# Patient Record
Sex: Male | Born: 1990
Health system: Southern US, Academic
[De-identification: ages and names within clinical notes are randomized; demographics above are authoritative.]

## PROBLEM LIST (undated history)

## (undated) ENCOUNTER — Encounter

---

## 2016-10-07 DIAGNOSIS — M25512 Pain in left shoulder: Principal | ICD-10-CM

## 2016-10-08 ENCOUNTER — Inpatient Hospital Stay: Attending: Orthopaedic Surgery | Primary: Family Medicine

## 2016-12-04 DIAGNOSIS — L03011 Cellulitis of right finger: Secondary | ICD-10-CM

## 2016-12-04 DIAGNOSIS — R7982 Elevated C-reactive protein (CRP): Secondary | ICD-10-CM

## 2016-12-04 DIAGNOSIS — B9562 Methicillin resistant Staphylococcus aureus infection as the cause of diseases classified elsewhere: Secondary | ICD-10-CM

## 2016-12-04 DIAGNOSIS — F17210 Nicotine dependence, cigarettes, uncomplicated: Secondary | ICD-10-CM

## 2016-12-04 DIAGNOSIS — R000 Tachycardia, unspecified: Secondary | ICD-10-CM

## 2016-12-04 DIAGNOSIS — L02511 Cutaneous abscess of right hand: Secondary | ICD-10-CM

## 2016-12-04 DIAGNOSIS — K5903 Drug induced constipation: Secondary | ICD-10-CM

## 2016-12-04 DIAGNOSIS — A419 Sepsis, unspecified organism: Secondary | ICD-10-CM

## 2016-12-04 DIAGNOSIS — T63484A Toxic effect of venom of other arthropod, undetermined, initial encounter: Secondary | ICD-10-CM

## 2016-12-04 DIAGNOSIS — S60469A Insect bite (nonvenomous) of unspecified finger, initial encounter: Principal | ICD-10-CM

## 2016-12-04 DIAGNOSIS — M25531 Pain in right wrist: Secondary | ICD-10-CM

## 2016-12-04 DIAGNOSIS — E7800 Pure hypercholesterolemia, unspecified: Principal | ICD-10-CM

## 2016-12-04 DIAGNOSIS — W57XXXA Bitten or stung by nonvenomous insect and other nonvenomous arthropods, initial encounter: Secondary | ICD-10-CM

## 2016-12-04 MED ORDER — TRAMADOL HCL 50 MG PO TABS
50 mg | Freq: Four times a day (QID) | ORAL | 0 refills | Status: CP | PRN
Start: 2016-12-04 — End: 2016-12-05

## 2016-12-04 MED ORDER — NAPROXEN 250 MG PO TABS
500 mg | Freq: Once | ORAL | Status: CP
Start: 2016-12-04 — End: ?

## 2016-12-04 MED ORDER — CEPHALEXIN 500 MG PO CAPS
500 mg | Freq: Once | ORAL | Status: CP
Start: 2016-12-04 — End: ?

## 2016-12-04 MED ORDER — NAPROXEN 500 MG PO TABS
500 mg | Freq: Two times a day (BID) | ORAL | 0 refills | Status: CP | PRN
Start: 2016-12-04 — End: 2016-12-05

## 2016-12-04 MED ORDER — CLINDAMYCIN HCL 150 MG PO CAPS
300 mg | Freq: Once | ORAL | Status: CP
Start: 2016-12-04 — End: ?

## 2016-12-04 MED ORDER — CEPHALEXIN 500 MG PO CAPS
500 mg | Freq: Four times a day (QID) | ORAL | 0 refills | Status: CP
Start: 2016-12-04 — End: 2016-12-05

## 2016-12-04 MED ORDER — CLINDAMYCIN HCL 150 MG PO CAPS
300 mg | Freq: Three times a day (TID) | ORAL | 0 refills | Status: CP
Start: 2016-12-04 — End: 2016-12-05

## 2016-12-04 NOTE — ED Notes
Time of discharge: 0321  AM., Patient discharged to  Home.  Patient discharged  ambulatory. to exit with belongings in  Stable condition.  Patient escorted by  friend., Written discharge instructions given to  patient.  Patient/recipient  verbalizes discharge instructions.

## 2016-12-04 NOTE — ED Provider Notes
Gastrointestinal: Negative for nausea, vomiting and abdominal pain.   Genitourinary: Negative for dysuria and difficulty urinating.   Musculoskeletal: Negative for back pain and gait problem.   Skin: Positive for wound. Negative for rash.   Neurological: Negative for seizures and facial asymmetry.   All other systems reviewed and are negative.      Physical Exam     ED Triage Vitals   BP    Pulse    Resp    Temp    Temp src    Height    Weight    SpO2    BMI (Calculated)              Physical Exam   Skin: There is erythema.   Small bite to the right index finger with surrounding erythema involving part of the hand. No fluctuance. Some induration at the finger. No active discharge. Good ROM. Pulses intact.       Differential DDx: insect bite infected, cellulitis, abscess, tenosynovitis    Is this an Emergent Medical Condition? Yes - Severe Pain/Acute Onset of Symptons  409.901 FS  641.19 FS  627.732 (16) FS    ED Workup   Procedures    Labs:  - - No data to display      Imaging (Read by ED Provider):  not applicable      EKG (Read by ED Provider):  not applicable        ED Course & Re-Evaluation     ED Course        Dishong, Harlon DittyWilliam Patrick* 2:56 AM 12/04/2016     MDM   Decide to obtain history from someone other than the patient: No    Decide to obtain previous medical records: No    Clinical Lab Test(s): N/A    Diagnostic Tests (Radiology, EKG): N/A    Independent Visualization (ED US, Wet Prep, Other): No    Discussed patient with NON-ED Provider: None      ED Disposition   ED Disposition: No ED Disposition Set      ED Clinical Impression   ED Clinical Impression:   No Clinical Impression Set      ED Patient Status   Patient Status:   Good        ED Medical Evaluation Initiated   Medical Evaluation Initiated:  Yes, filed at 12/04/16 0248  by Dishong, Harlon DittyWilliam Patrick, MD            Dishong, Harlon DittyWilliam Patrick, MD  12/04/16 [phone]    Scribe Attestation: Dortha SchwalbeI, Josie Nguyen, have acted as a Neurosurgeonscribe for Hughes SupplyDishong,

## 2016-12-04 NOTE — ED Provider Notes
Physician Attestation: I have reviewed and confirmed the information stated by the scribe and made corrections and edits as appropriate. I have personally provided the services documented by the scribe.           Dishong, Harlon DittyWilliam Patrick, MD  12/04/16 [phone]

## 2016-12-04 NOTE — ED Triage Notes
Pt c/o a spider bite on his R index finger with pain radiating to hand, pt sts was bitten by a spider while at work x 1 day ago, pt sts now swelling and redness are getting worse, pt is AA&Ox4, NAD, VSS, calm cooperative, resp. Even and nonlabored, skin w/d color appropriate for ethnicity, no drainage noted, small abraision noted on posterior R index finger.

## 2016-12-04 NOTE — ED Provider Notes
History     Chief Complaint   Patient presents with   ? Cellulitis       Steve Garza is a 26 y.o. male who presents to the ED with CC of R index finger swelling and pain that onset last night. Patient suspects he was bitten by a spider at work, noted lesion to the right index finger. States within the past hour swelling has spread to the dorsum of his hand. He was unable to sleep tonight due to throbbing pain so he came to the ED.      The history is provided by the patient. No language interpreter was used.   Cellulitis    This is a new problem. The current episode started yesterday. The onset was gradual. The problem occurs continuously. The problem has been gradually worsening. The abscess is present on the right hand and right fingers. The problem is moderate. The abscess is characterized by painfulness and swelling. The patient was exposed to an insect bite/sting. The abscess first occurred outside. Pertinent negatives include no fever, no vomiting and no cough. He has received no recent medical care.       No Known Allergies    Patient's Medications   New Prescriptions    CEPHALEXIN (KEFLEX) 500 MG PO CAPSULE    Take 1 capsule by mouth 4 times daily for 5 days.    CLINDAMYCIN (CLEOCIN) 150 MG PO CAPSULE    Take 2 capsules by mouth 3 times daily for 5 days.    NAPROXEN (NAPROSYN) 500 MG PO TABLET    Take 1 tablet by mouth 2 times daily as needed for other (pain).    TRAMADOL (ULTRAM) 50 MG PO TABLET    Take 1 tablet by mouth every 6 hours as needed for pain.   Previous Medications    No medications on file   Modified Medications    No medications on file   Discontinued Medications    No medications on file       Past Medical History:   Diagnosis Date   ? High cholesterol        History reviewed. No pertinent surgical history.    No family history on file.    Social History     Social History   ? Marital status: Married     Spouse name: N/A   ? Number of children: N/A   ? Years of education: N/A

## 2016-12-04 NOTE — ED Provider Notes
History   No chief complaint on file.      HPI    Allergies not on file    Patient's Medications    No medications on file       No past medical history on file.    No past surgical history on file.    No family history on file.    Social History     Social History   ? Marital status: Married     Spouse name: N/A   ? Number of children: N/A   ? Years of education: N/A     Social History Main Topics   ? Smoking status: Not on file   ? Smokeless tobacco: Not on file   ? Alcohol use Not on file   ? Drug use: Unknown   ? Sexual activity: Not on file     Other Topics Concern   ? Not on file     Social History Narrative   ? No narrative on file       Review of Systems    Physical Exam     ED Triage Vitals   BP    Pulse    Resp    Temp    Temp src    Height    Weight    SpO2    BMI (Calculated)              Physical Exam    Differential DDx: ***    Is this an Emergent Medical Condition? {SH ED EMERGENT MEDICAL CONDITION:1602057001}  409.901 FS  641.19 FS  627.732 (16) FS    ED Workup   Procedures    Labs:  - - No data to display      Imaging (Read by ED Provider):  {Imaging findings:1603150103}      EKG (Read by ED Provider):  {EKG findings:1603150102}        ED Course & Re-Evaluation     ED Course          MDM   Decide to obtain history from someone other than the patient: {SH ED JX MDM - OBTAIN HISTORY:28378}    Decide to obtain previous medical records: {SH ED JX MDM - PREVIOUS MED REC - NO YES:28380}    Clinical Lab Test(s): {SH ED JX MDM ORDERED AND REVIEWED:28124}    Diagnostic Tests (Radiology, EKG): {SH ED JX MDM ORDERED AND REVIEWED:28124}    Independent Visualization (ED US, Wet Prep, Other): {SH ED JX MDM NO YES WILDCARD:26444}    Discussed patient with NON-ED Provider: {SH ED JX MDM - ANOTHER PROVIDER:28381}      ED Disposition   ED Disposition: No ED Disposition Set      ED Clinical Impression   ED Clinical Impression:   No Clinical Impression Set      ED Patient Status   Patient Status:

## 2016-12-04 NOTE — ED Provider Notes
Steve DittyWilliam Garza* 2:50 AM 12/04/2016     Physician Attestation: Marland Kitchen***

## 2016-12-04 NOTE — ED Provider Notes
Gastrointestinal: Negative for nausea, vomiting and abdominal pain.   Genitourinary: Negative for dysuria and difficulty urinating.   Musculoskeletal: Negative for back pain and gait problem.   Skin: Positive for wound. Negative for rash.   Neurological: Negative for seizures and facial asymmetry.   All other systems reviewed and are negative.      Physical Exam     ED Triage Vitals   BP    Pulse    Resp    Temp    Temp src    Height    Weight    SpO2    BMI (Calculated)              Physical Exam   Skin: There is erythema.   Small bite to the right index finger with surrounding erythema involving part of the hand. No fluctuance. Some induration at the finger. No active discharge. Good ROM. Pulses intact.       Differential DDx: insect bite infected, cellulitis, abscess, tenosynovitis    Is this an Emergent Medical Condition? Yes - Severe Pain/Acute Onset of Symptons  409.901 FS  641.19 FS  627.732 (16) FS    ED Workup   Procedures    Labs:  - - No data to display      Imaging (Read by ED Provider):  not applicable      EKG (Read by ED Provider):  not applicable        ED Course & Re-Evaluation     ED Course      26 y.o. M with CC of cellulitis to the right index finger extending into the hand. Patient to be given naproxen, Keflex, and clindamycin.  Dishong, Harlon DittyWilliam Patrick* 2:56 AM 12/04/2016     Eforcse reviewed and is negative.   Dishong, Harlon DittyWilliam Patrick* 2:59 AM 12/04/2016     MDM   Decide to obtain history from someone other than the patient: No    Decide to obtain previous medical records: No    Clinical Lab Test(s): N/A    Diagnostic Tests (Radiology, EKG): N/A    Independent Visualization (ED US, Wet Prep, Other): No    Discussed patient with NON-ED Provider: None      ED Disposition   ED Disposition: No ED Disposition Set      ED Clinical Impression   ED Clinical Impression:   No Clinical Impression Set      ED Patient Status   Patient Status:   Good        ED Medical Evaluation Initiated

## 2016-12-04 NOTE — ED Provider Notes
discharge. Good ROM. Pulses intact.   Psychiatric: He has a normal mood and affect.   Linear thought process   Nursing note and vitals reviewed.      Differential DDx: insect bite infected, cellulitis, abscess, tenosynovitis    Is this an Emergent Medical Condition? Yes - Severe Pain/Acute Onset of Symptons  409.901 FS  641.19 FS  627.732 (16) FS    ED Workup   Procedures    Labs:  - - No data to display      Imaging (Read by ED Provider):  not applicable      EKG (Read by ED Provider):  not applicable        ED Course & Re-Evaluation     ED Course      26 y.o. M with CC of cellulitis to the right index finger extending into the hand. Patient to be given naproxen, Keflex, and clindamycin.  Dishong, Harlon DittyWilliam Patrick* 2:56 AM 12/04/2016     Eforcse reviewed and is negative.   Dishong, Harlon DittyWilliam Patrick* 2:59 AM 12/04/2016     No abscess at this point. Discussed precautions for abscess, deep seated infection, tenosynovitis.  Pt aware of reasons for return to ED. Cellulitis marked.  Answered questions to the best of my ability.  Pt aware of precautions for return.  Pt comfortable with plan.      MDM   Decide to obtain history from someone other than the patient: No    Decide to obtain previous medical records: No    Clinical Lab Test(s): N/A    Diagnostic Tests (Radiology, EKG): N/A    Independent Visualization (ED US, Wet Prep, Other): No    Discussed patient with NON-ED Provider: None      ED Disposition   ED Disposition: Discharge      ED Clinical Impression   ED Clinical Impression:   Arthropod bite of finger, initial encounter  Cellulitis of finger of right hand      ED Patient Status   Patient Status:   Good        ED Medical Evaluation Initiated   Medical Evaluation Initiated:  Yes, filed at 12/04/16 0248  by Dishong, Harlon DittyWilliam Patrick, MD            Dishong, Harlon DittyWilliam Patrick, MD  12/04/16 0248    Scribe Attestation: Dortha SchwalbeI, Josie Nguyen, have acted as a Neurosurgeonscribe for Geanie Kenningishong, William Patrick* 2:50 AM 12/04/2016

## 2016-12-04 NOTE — ED Provider Notes
Social History Main Topics   ? Smoking status: Current Every Day Smoker     Packs/day: 1.00     Types: Cigarettes   ? Smokeless tobacco: Never Used   ? Alcohol use Yes      Comment: rarely   ? Drug use: No   ? Sexual activity: Not Asked     Other Topics Concern   ? None     Social History Narrative   ? None       Review of Systems   Constitutional: Negative for fever and chills.   HENT: Negative for facial swelling and voice change.    Eyes: Negative for pain and discharge.   Respiratory: Negative for cough and shortness of breath.    Cardiovascular: Negative for chest pain and palpitations.   Gastrointestinal: Negative for nausea, vomiting and abdominal pain.   Genitourinary: Negative for dysuria and difficulty urinating.   Musculoskeletal: Negative for back pain and gait problem.   Skin: Positive for wound. Negative for rash.   Neurological: Negative for seizures and facial asymmetry.   All other systems reviewed and are negative.      Physical Exam     ED Triage Vitals [12/04/16 0256]   BP 143/83   Pulse 78   Resp 16   Temp 36.8 ?C (98.2 ?F)   Temp src Oral   Height    Weight    SpO2 98 %   BMI (Calculated)              Physical Exam   Skin: There is erythema.   Small bite to the right index finger with surrounding erythema involving part of the hand. No fluctuance. Some induration at the finger. No active discharge. Good ROM. Pulses intact.       Differential DDx: insect bite infected, cellulitis, abscess, tenosynovitis    Is this an Emergent Medical Condition? Yes - Severe Pain/Acute Onset of Symptons  409.901 FS  641.19 FS  627.732 (16) FS    ED Workup   Procedures    Labs:  - - No data to display      Imaging (Read by ED Provider):  not applicable      EKG (Read by ED Provider):  not applicable        ED Course & Re-Evaluation     ED Course      26 y.o. M with CC of cellulitis to the right index finger extending into the hand. Patient to be given naproxen, Keflex, and clindamycin.

## 2016-12-04 NOTE — ED Provider Notes
Dishong, Harlon DittyWilliam Patrick* 2:56 AM 12/04/2016     Eforcse reviewed and is negative.   Dishong, Harlon DittyWilliam Patrick* 2:59 AM 12/04/2016     MDM   Decide to obtain history from someone other than the patient: No    Decide to obtain previous medical records: No    Clinical Lab Test(s): N/A    Diagnostic Tests (Radiology, EKG): N/A    Independent Visualization (ED US, Wet Prep, Other): No    Discussed patient with NON-ED Provider: None      ED Disposition   ED Disposition: Discharge      ED Clinical Impression   ED Clinical Impression:   Arthropod bite of finger, initial encounter  Cellulitis of finger of right hand      ED Patient Status   Patient Status:   Good        ED Medical Evaluation Initiated   Medical Evaluation Initiated:  Yes, filed at 12/04/16 0248  by Dishong, Harlon DittyWilliam Patrick, MD            Dishong, Harlon DittyWilliam Patrick, MD  12/04/16 0248    Scribe Attestation: Dortha SchwalbeI, Josie Nguyen, have acted as a Neurosurgeonscribe for Geanie KenningDishong, William Patrick* 2:50 AM 12/04/2016     Physician Attestation: Marland Kitchen***

## 2016-12-04 NOTE — ED Provider Notes
History   No chief complaint on file.      Steve [name] is a 26 y.o. male who presents to the ED with CC of R index finger swelling and pain that onset last night. Patient suspects he was bitten by a spider at work, noted lesion to the right index finger. States within the past hour swelling has spread to the dorsum of his hand. He was unable to sleep tonight due to throbbing pain so he came to the ED.      The history is provided by the patient. No language interpreter was used.   Cellulitis    This is a new problem. The current episode started yesterday. The onset was gradual. The problem occurs continuously. The problem has been gradually worsening. The abscess is present on the right hand and right fingers. The problem is moderate. The abscess is characterized by painfulness and swelling. The patient was exposed to an insect bite/sting. The abscess first occurred outside. Pertinent negatives include no fever, no vomiting and no cough. He has received no recent medical care.       Allergies not on file    Patient's Medications    No medications on file       No past medical history on file.    No past surgical history on file.    No family history on file.    Social History     Social History   ? Marital status: Married     Spouse name: N/A   ? Number of children: N/A   ? Years of education: N/A     Social History Main Topics   ? Smoking status: Not on file   ? Smokeless tobacco: Not on file   ? Alcohol use Not on file   ? Drug use: Unknown   ? Sexual activity: Not on file     Other Topics Concern   ? Not on file     Social History Narrative   ? No narrative on file       Review of Systems   Constitutional: Negative for fever and chills.   HENT: Negative for facial swelling and voice change.    Eyes: Negative for pain and discharge.   Respiratory: Negative for cough and shortness of breath.    Cardiovascular: Negative for chest pain and palpitations.

## 2016-12-04 NOTE — ED Provider Notes
{  SH ED Lamonte SakaiJX PATIENT [phone]}        ED Medical Evaluation Initiated   Medical Evaluation Initiated:  Yes, filed at 12/04/16 0248  by Dishong, Harlon DittyWilliam Patrick, MD            Dishong, Harlon DittyWilliam Patrick, MD  12/04/16 [phone]

## 2016-12-04 NOTE — ED Provider Notes
Medical Evaluation Initiated:  Yes, filed at 12/04/16 0248  by Dishong, Harlon DittyWilliam Patrick, MD            Dishong, Harlon DittyWilliam Patrick, MD  12/04/16 [phone]    Scribe Attestation: Dortha SchwalbeI, Josie Nguyen, have acted as a Neurosurgeonscribe for Geanie KenningDishong, William Patrick* 2:50 AM 12/04/2016     Physician Attestation: Marland Kitchen***

## 2016-12-04 NOTE — ED Provider Notes
Social History Main Topics   ? Smoking status: Current Every Day Smoker     Packs/day: 1.00     Types: Cigarettes   ? Smokeless tobacco: Never Used   ? Alcohol use Yes      Comment: rarely   ? Drug use: No   ? Sexual activity: Not Asked     Other Topics Concern   ? None     Social History Narrative   ? None       Review of Systems   Constitutional: Negative for fever and chills.   HENT: Negative for facial swelling and voice change.    Eyes: Negative for pain and discharge.   Respiratory: Negative for cough and shortness of breath.    Cardiovascular: Negative for chest pain and palpitations.   Gastrointestinal: Negative for nausea, vomiting and abdominal pain.   Genitourinary: Negative for dysuria and difficulty urinating.   Musculoskeletal: Negative for back pain and gait problem.   Skin: Positive for wound. Negative for rash.   Neurological: Negative for seizures and facial asymmetry.   All other systems reviewed and are negative.      Physical Exam     ED Triage Vitals [12/04/16 0256]   BP 143/83   Pulse 78   Resp 16   Temp 36.8 ?C (98.2 ?F)   Temp src Oral   Height    Weight    SpO2 98 %   BMI (Calculated)              Physical Exam   Constitutional: He is oriented to person, place, and time. He appears well-developed and well-nourished. No distress.   HENT:   Head: Normocephalic and atraumatic.   MMM   Eyes: Conjunctivae are normal. Pupils are equal, round, and reactive to light.   Cardiovascular: Normal rate, regular rhythm, normal heart sounds and intact distal pulses.    Pulmonary/Chest: Effort normal and breath sounds normal.   Musculoskeletal:   Extremities well perfursed. Distal NV intact.     Neurological: He is alert and oriented to person, place, and time.   Skin: Skin is warm and dry. No rash noted. He is not diaphoretic. There is erythema.   Small bite to the right index finger with surrounding erythema involving part of the hand. No fluctuance. Some induration at the finger. No active

## 2016-12-05 ENCOUNTER — Inpatient Hospital Stay
Admit: 2016-12-05 | Discharge: 2016-12-09 | Disposition: A | Discharge status: Home / Self Care | Attending: Certified Registered" | Admitting: Internal Medicine

## 2016-12-05 ENCOUNTER — Inpatient Hospital Stay
Admit: 2016-12-05 | Discharge: 2016-12-09 | Disposition: A | Discharge status: Home / Self Care | Admitting: Internal Medicine

## 2016-12-05 ENCOUNTER — Inpatient Hospital Stay: Admit: 2016-12-05 | Discharge: 2016-12-09

## 2016-12-05 ENCOUNTER — Observation Stay: Admit: 2016-12-05 | Discharge: 2016-12-09 | Primary: Family Medicine

## 2016-12-05 DIAGNOSIS — L03011 Cellulitis of right finger: Secondary | ICD-10-CM

## 2016-12-05 DIAGNOSIS — K5903 Drug induced constipation: Secondary | ICD-10-CM

## 2016-12-05 DIAGNOSIS — E7800 Pure hypercholesterolemia, unspecified: Principal | ICD-10-CM

## 2016-12-05 DIAGNOSIS — L02511 Cutaneous abscess of right hand: Secondary | ICD-10-CM

## 2016-12-05 DIAGNOSIS — B999 Unspecified infectious disease: Secondary | ICD-10-CM

## 2016-12-05 DIAGNOSIS — B9562 Methicillin resistant Staphylococcus aureus infection as the cause of diseases classified elsewhere: Secondary | ICD-10-CM

## 2016-12-05 DIAGNOSIS — A419 Sepsis, unspecified organism: Principal | ICD-10-CM

## 2016-12-05 DIAGNOSIS — D72829 Elevated white blood cell count, unspecified: Secondary | ICD-10-CM

## 2016-12-05 DIAGNOSIS — T63484A Toxic effect of venom of other arthropod, undetermined, initial encounter: Secondary | ICD-10-CM

## 2016-12-05 DIAGNOSIS — R000 Tachycardia, unspecified: Secondary | ICD-10-CM

## 2016-12-05 DIAGNOSIS — R7982 Elevated C-reactive protein (CRP): Secondary | ICD-10-CM

## 2016-12-05 DIAGNOSIS — M25531 Pain in right wrist: Secondary | ICD-10-CM

## 2016-12-05 DIAGNOSIS — M79641 Pain in right hand: Secondary | ICD-10-CM

## 2016-12-05 DIAGNOSIS — F17210 Nicotine dependence, cigarettes, uncomplicated: Secondary | ICD-10-CM

## 2016-12-05 DIAGNOSIS — Z720 Tobacco use: Secondary | ICD-10-CM

## 2016-12-05 DIAGNOSIS — L03119 Cellulitis of unspecified part of limb: Secondary | ICD-10-CM

## 2016-12-05 MED ORDER — SODIUM CHLORIDE 0.9 % IV SOLN
INTRAVENOUS | Status: DC
Start: 2016-12-05 — End: 2016-12-06

## 2016-12-05 MED ORDER — CLINDAMYCIN PHOSPHATE IN D5W 900 MG/50ML IV SOLN
900 mg | Freq: Three times a day (TID) | INTRAVENOUS | Status: DC
Start: 2016-12-05 — End: 2016-12-06

## 2016-12-05 MED ORDER — SULFAMETHOXAZOLE-TRIMETHOPRIM 800-160 MG PO TABS
1 | ORAL_TABLET | Freq: Once | ORAL | Status: DC
Start: 2016-12-05 — End: 2016-12-05

## 2016-12-05 MED ORDER — SULFAMETHOXAZOLE-TRIMETHOPRIM 800-160 MG PO TABS
1 | ORAL_TABLET | Freq: Two times a day (BID) | ORAL | Status: DC
Start: 2016-12-05 — End: 2016-12-06

## 2016-12-05 MED ORDER — HYDROCODONE-ACETAMINOPHEN 5-325 MG PO TABS
1 | ORAL_TABLET | ORAL | Status: DC | PRN
Start: 2016-12-05 — End: 2016-12-10

## 2016-12-05 MED ORDER — ACETAMINOPHEN 325 MG PO TABS
650 mg | ORAL | Status: DC | PRN
Start: 2016-12-05 — End: 2016-12-06

## 2016-12-05 MED ORDER — CEPHALEXIN 500 MG PO CAPS
500 mg | Freq: Once | ORAL | Status: CP
Start: 2016-12-05 — End: ?

## 2016-12-05 MED ORDER — ONDANSETRON 4 MG PO TBDP
4 mg | Freq: Once | ORAL | Status: CP
Start: 2016-12-05 — End: ?

## 2016-12-05 MED ORDER — ALUM & MAG HYDROX-SIMETH 1200-1200-120 MG/30ML PO SUSP UD
30 mL | ORAL | Status: DC | PRN
Start: 2016-12-05 — End: 2016-12-06

## 2016-12-05 MED ORDER — TEMAZEPAM 15 MG PO CAPS
15 mg | Freq: Every evening | ORAL | Status: CP | PRN
Start: 2016-12-05 — End: ?

## 2016-12-05 MED ORDER — MORPHINE SULFATE 4 MG/ML IV SOLN CUSTOM JX
4 mg | Freq: Four times a day (QID) | INTRAVENOUS | Status: DC | PRN
Start: 2016-12-05 — End: 2016-12-07

## 2016-12-05 MED ORDER — LIDOCAINE HCL (PF) 1 % IJ SOLN SH
Freq: Once | Status: CP
Start: 2016-12-05 — End: ?

## 2016-12-05 MED ORDER — CEPHALEXIN 500 MG PO CAPS
500 mg | Freq: Once | ORAL | Status: DC
Start: 2016-12-05 — End: 2016-12-05

## 2016-12-05 NOTE — ED Notes
Bed: ED-09  Expected date:   Expected time:   Means of arrival:   Comments:  abscess

## 2016-12-05 NOTE — Consults
Current Outpatient Prescriptions on File Prior to Encounter   Medication Sig Dispense Refill   ? [DISCONTINUED] cephALEXin (KEFLEX) 500 MG PO Capsule Take 1 capsule by mouth 4 times daily for 5 days. 20 capsule 0   ? [DISCONTINUED] clindamycin (CLEOCIN) 150 MG PO Capsule Take 2 capsules by mouth 3 times daily for 5 days. 30 capsule 0   ? [DISCONTINUED] naproxen (NAPROSYN) 500 MG PO Tablet Take 1 tablet by mouth 2 times daily as needed for other (pain). 20 tablet 0   ? [DISCONTINUED] traMADol (ULTRAM) 50 MG PO Tablet Take 1 tablet by mouth every 6 hours as needed for pain. 10 tablet 0     Current Hospital Medications:  Scheduled:   ? ondansetron  4 mg Oral Once   ? sulfamethoxazole-trimethoprim  1 tablet Oral Q12H SCH     Continuous Infusions:   ? 0.9 % NaCl     ? 0.9 % NaCl     ? clindamycin       PRN:   acetaminophen 650 mg Oral Q4H PRN   aluminum-magnesium-simethicone hydroxide 30 mL Oral Q4H PRN   temazepam 15 mg Oral Nightly PRN     Allergies:  Patient has no known allergies.    Review of Systems:  All systems are negative except for:    As mentioned in the HPI.       Objective:      Patient Vitals for the past 8 hrs:   BP Temp Temp src Pulse Resp SpO2 Height Weight   12/05/16 1545 - - - 84 - 99 % - -   12/05/16 1539 (!) 122/100 - - - - - - -   12/05/16 1315 123/70 36.6 ?C (97.9 ?F) Oral 85 16 97 % 1.778 m (5' 10") 101.6 kg (224 lb)         Physical Exam  General:  Alert, oriented x 3, no acute distress    Cardiac:  Palpable pulses, normal rate in a regular rhythm    Respiratory:  Symmetric chest rise, good air movement    Right Upper Extremity:  Palpable radial pulse.  Sensation to light touch present in median/ulnar/radial/axillary nerve distributions.   Motor functioning in median/ulnar/radial/axillary nerve distributions.  AIN/PIN functioning  2cm area of fluctuance over dorsal index P1. Mild erythema present proximally to level of wrist.

## 2016-12-05 NOTE — ED Notes
Spoke with [facility]

## 2016-12-05 NOTE — ED Notes
[facility]. Patient stable, VSS, NAD, AOX3. States no needs at this time. Awaiting [place]'s arrival [place] [facility].

## 2016-12-05 NOTE — ED Triage Notes
Patient presents to ED c/o increased swelling, redness, and pain to infection area on right hand. He states he was unable to afford antibiotics. NAD. VSS. AOx4. Otherwiese skin warm and dry. Breathing unlabored, RR even. Oriented to room and call bell. States pain is 9/10, constant, throbbing and tight.

## 2016-12-05 NOTE — Consults
Department of Orthopedics  Consultation Note    Date of Consult: 12/05/2016    Requesting Physician: Sabin, PA-C     Subjective:      Reason for request: Right hand infection    Steve Garza is a 26 y.o. male who presents with 5 days of worsening pain and swelling about his right index finger and moving proximally about dorsum of hand.  Patient complains of pain in Right index finger, hand, and forearm. Patient describes pain as aching, moderate severity and stable. Modifying factors are nothing and has no associated symptoms. Patient states pain is 6 on a scale of 1-10.  Denies fevers, chills, malaise.    Denies pain in other locations. Denies numbness or tingling in extremities. Denies chest pain or shortness of breath. Denies loss of consciousness. Denies bowel or bladder incontinence.    1ppd smoker. Not diabetic. No steroid use.  No Family history of DVT  Last ate or drank at 5pm  Right handed  Insurance: Medicaid  Work: [employer]  Residence: [place]  Contact phone number: [phone]    There is no problem list on file for this patient.    Past Medical History:   Diagnosis Date   ? High cholesterol       History reviewed. No pertinent surgical history.  History reviewed. No pertinent family history.  Social History     Social History   ? Marital status: Married     Spouse name: N/A   ? Number of children: N/A   ? Years of education: N/A     Occupational History   ? Not on file.     Social History Main Topics   ? Smoking status: Current Every Day Smoker     Packs/day: 1.00     Types: Cigarettes   ? Smokeless tobacco: Never Used   ? Alcohol use Yes      Comment: rarely   ? Drug use: No   ? Sexual activity: Not on file     Other Topics Concern   ? Not on file     Social History Narrative   ? No narrative on file       I have confirmed the past medical, surgical, family and social history.    Home Medications:  No current facility-administered medications on file prior to encounter.

## 2016-12-05 NOTE — Consults
Current Outpatient Prescriptions on File Prior to Encounter   Medication Sig Dispense Refill   ? [DISCONTINUED] cephALEXin (KEFLEX) 500 MG PO Capsule Take 1 capsule by mouth 4 times daily for 5 days. 20 capsule 0   ? [DISCONTINUED] clindamycin (CLEOCIN) 150 MG PO Capsule Take 2 capsules by mouth 3 times daily for 5 days. 30 capsule 0   ? [DISCONTINUED] naproxen (NAPROSYN) 500 MG PO Tablet Take 1 tablet by mouth 2 times daily as needed for other (pain). 20 tablet 0   ? [DISCONTINUED] traMADol (ULTRAM) 50 MG PO Tablet Take 1 tablet by mouth every 6 hours as needed for pain. 10 tablet 0     Current Hospital Medications:  Scheduled:   ? ondansetron  4 mg Oral Once   ? sulfamethoxazole-trimethoprim  1 tablet Oral Q12H SCH     Continuous Infusions:   ? 0.9 % NaCl     ? 0.9 % NaCl     ? clindamycin       PRN:   acetaminophen 650 mg Oral Q4H PRN   aluminum-magnesium-simethicone hydroxide 30 mL Oral Q4H PRN   temazepam 15 mg Oral Nightly PRN     Allergies:  Patient has no known allergies.    Review of Systems:  All systems are negative except for:    As mentioned in the HPI.       Objective:      Patient Vitals for the past 8 hrs:   BP Temp Temp src Pulse Resp SpO2 Height Weight   12/05/16 1545 - - - 84 - 99 % - -   12/05/16 1539 (!) 122/100 - - - - - - -   12/05/16 1315 123/70 36.6 ?C (97.9 ?F) Oral 85 16 97 % 1.778 m (5\' 10" ) 101.6 kg (224 lb)         Physical Exam  General:  Alert, oriented x 3, no acute distress    Cardiac:  Palpable pulses, normal rate in a regular rhythm    Respiratory:  Symmetric chest rise, good air movement    Right Upper Extremity:  Palpable radial pulse.  Sensation to light touch present in median/ulnar/radial/axillary nerve distributions.   Motor functioning in median/ulnar/radial/axillary nerve distributions.  AIN/PIN functioning  2cm area of fluctuance over dorsal index P1. Mild erythema present proximally to level of wrist.

## 2016-12-05 NOTE — Consults
Tender to palpation of index finger and dorsally and proximally to level of mid forearm. No tenderness over flexor tendon sheath.   No deformities or crepitus.  Compartments soft/compressible. No pain with passive stretch of fingers.    Data Review:   Xrays: Plain films were reviewed by orthopaedic team  XR R hand: No osseous abnormality    CRP: 31  ESR: 13  WBC: 11.9    Procedure  Verbal consent for the procedure was obtained from the patient. Correct site was confirmed verbally. Time out performed. Skin was prepared with alcohol. An 11 blade scalpel was used to incise the dorsal aspect of R index finger abscess. Approximately 2cc of blood and thick purulent material was expressed.      Assessment & Plan:     26 y.o. male with [name].      -The nature of the problem was discussed in detail with the patient.  The alternative treatments were discussed with the patient.  The anticipated benefits and expected outcomes of the proposed treatments were discussed, as well as the risks and benefits associated with the treatment options.  In addition, the consequences of no treatment were discussed in detail with the patient.  The patient was given a chance to ask any questions they may have had about the proposed treatment plan, and these questions were answered by the treating physician.   -pain control per ED  -No weight bearing R hand  -Wound care: Allow index finger to drain overnight  -IV Clindamycin per ED  -Monitor for acutely worsening exam overnight including fevers, chills, and tenderness migrating proximally to level of elbow.  -NPO at midnight in case of OR tomorrow.  -Dispo with Ortho Hand team in the AM and reexamine for evidence of improvement on abx. Will update recs in AM.     Steve Garza  12/05/2016   7:13 PM

## 2016-12-05 NOTE — Consults
Tender to palpation of index finger and dorsally and proximally to level of mid forearm. No tenderness over flexor tendon sheath.   No deformities or crepitus.  Compartments soft/compressible. No pain with passive stretch of fingers.    Data Review:   Xrays: Plain films were reviewed by orthopaedic team  XR R hand: No osseous abnormality    CRP: 31  ESR: 13  WBC: 11.9    Procedure  Verbal consent for the procedure was obtained from the patient. Correct site was confirmed verbally. Time out performed. Skin was prepared with alcohol. An 11 blade scalpel was used to incise the dorsal aspect of R index finger abscess. Approximately 2cc of blood and thick purulent material was expressed.      Assessment & Plan:     26 y.o. male with [name].      -The nature of the problem was discussed in detail with the patient.  The alternative treatments were discussed with the patient.  The anticipated benefits and expected outcomes of the proposed treatments were discussed, as well as the risks and benefits associated with the treatment options.  In addition, the consequences of no treatment were discussed in detail with the patient.  The patient was given a chance to ask any questions they may have had about the proposed treatment plan, and these questions were answered by the treating physician.   -pain control per ED  -No weight bearing R hand  -Wound care: Allow index finger to drain overnight  -IV Clindamycin per ED  -Monitor for acutely worsening exam overnight including fevers, chills, and tenderness migrating proximally to level of elbow.  -NPO at midnight in case of OR tomorrow.  -Dispo with Ortho Hand team in the AM and reexamine for evidence of improvement on abx. Will update recs in AM.     Price Sessums  12/05/2016   7:13 PM

## 2016-12-05 NOTE — ED Triage Notes
Patient presents to ED c/o increased swelling, redness, and pain to infection area on right hand. He states he was unable to afford antibiotics. NAD. VSS. AOx4. Otherwiese skin warm and dry. Breathing unlabored, RR even. Oriented to room and call bell. States pain is 9/10, constant, throbbing and tight.

## 2016-12-05 NOTE — ED Notes
Patient receive to unit. Care assume, treatment in progress.

## 2016-12-05 NOTE — ED Notes
Report called to RN Myrna at [facility]. Patient stable, VSS, NAD, AOX3. States no needs at this time. Awaiting his wife's arrival [place] [facility].

## 2016-12-05 NOTE — ED Notes
Bed: ED-09  Expected date:   Expected time:   Means of arrival:   Comments:  abscess

## 2016-12-05 NOTE — Consults
Department of Orthopedics  Consultation Note    Date of Consult: 12/05/2016    Requesting Physician: Tamala FothergillSabin, PA-C     Subjective:      Reason for request: Right hand infection    Steve [name] is a 26 y.o. male who presents with 5 days of worsening pain and swelling about his right index finger and moving proximally about dorsum of hand.  Patient complains of pain in Right index finger, hand, and forearm. Patient describes pain as aching, moderate severity and stable. Modifying factors are nothing and has no associated symptoms. Patient states pain is 6 on a scale of 1-10.  Denies fevers, chills, malaise.    Denies pain in other locations. Denies numbness or tingling in extremities. Denies chest pain or shortness of breath. Denies loss of consciousness. Denies bowel or bladder incontinence.    1ppd smoker. Not diabetic. No steroid use.  No Family history of DVT  Last ate or drank at 5pm  Right handed  Insurance: Medicaid  Work: [employer]  [place]  Contact phone number: [phone]    There is no problem list on file for this patient.    Past Medical History:   Diagnosis Date   ? High cholesterol       History reviewed. No pertinent surgical history.  History reviewed. No pertinent family history.  Social History     Social History   ? Marital status: Married     Spouse name: N/A   ? Number of children: N/A   ? Years of education: N/A     Occupational History   ? Not on file.     Social History Main Topics   ? Smoking status: Current Every Day Smoker     Packs/day: 1.00     Types: Cigarettes   ? Smokeless tobacco: Never Used   ? Alcohol use Yes      Comment: rarely   ? Drug use: No   ? Sexual activity: Not on file     Other Topics Concern   ? Not on file     Social History Narrative   ? No narrative on file       I have confirmed the past medical, surgical, family and social history.    Home Medications:  No current facility-administered medications on file prior to encounter.

## 2016-12-06 DIAGNOSIS — K5903 Drug induced constipation: Secondary | ICD-10-CM

## 2016-12-06 DIAGNOSIS — L02511 Cutaneous abscess of right hand: Secondary | ICD-10-CM

## 2016-12-06 DIAGNOSIS — B9562 Methicillin resistant Staphylococcus aureus infection as the cause of diseases classified elsewhere: Secondary | ICD-10-CM

## 2016-12-06 DIAGNOSIS — M25531 Pain in right wrist: Secondary | ICD-10-CM

## 2016-12-06 DIAGNOSIS — B999 Unspecified infectious disease: Principal | ICD-10-CM

## 2016-12-06 DIAGNOSIS — R000 Tachycardia, unspecified: Secondary | ICD-10-CM

## 2016-12-06 DIAGNOSIS — A419 Sepsis, unspecified organism: Principal | ICD-10-CM

## 2016-12-06 DIAGNOSIS — E7800 Pure hypercholesterolemia, unspecified: Principal | ICD-10-CM

## 2016-12-06 DIAGNOSIS — T63484A Toxic effect of venom of other arthropod, undetermined, initial encounter: Secondary | ICD-10-CM

## 2016-12-06 DIAGNOSIS — R7982 Elevated C-reactive protein (CRP): Secondary | ICD-10-CM

## 2016-12-06 DIAGNOSIS — L03011 Cellulitis of right finger: Secondary | ICD-10-CM

## 2016-12-06 DIAGNOSIS — F17210 Nicotine dependence, cigarettes, uncomplicated: Secondary | ICD-10-CM

## 2016-12-06 MED ORDER — ACETAMINOPHEN 325 MG PO TABS
650 mg | ORAL | Status: DC | PRN
Start: 2016-12-06 — End: 2016-12-08

## 2016-12-06 MED ORDER — MIDAZOLAM HCL 2 MG/2ML IJ SOLN
Status: DC | PRN
Start: 2016-12-06 — End: 2016-12-06

## 2016-12-06 MED ORDER — FENTANYL CITRATE INJ 50 MCG/ML CUSTOM AMP/VIAL
Status: DC | PRN
Start: 2016-12-06 — End: 2016-12-06

## 2016-12-06 MED ORDER — LACTATED RINGERS IV SOLN
1000 mL | INTRAVENOUS | Status: CN
Start: 2016-12-06 — End: ?

## 2016-12-06 MED ORDER — ACETAMINOPHEN 10 MG/ML IV SOLN
1000 mg | Freq: Once | INTRAVENOUS | Status: DC
Start: 2016-12-06 — End: 2016-12-06

## 2016-12-06 MED ORDER — NALBUPHINE HCL 10 MG/ML IJ SOLN
2.5 mg | INTRAVENOUS | Status: DC | PRN
Start: 2016-12-06 — End: 2016-12-06

## 2016-12-06 MED ORDER — NALOXONE HCL 0.4 MG/ML IJ SOLN
.4 mg | INTRAVENOUS | Status: DC | PRN
Start: 2016-12-06 — End: 2016-12-06

## 2016-12-06 MED ORDER — PROPOFOL 10 MG/ML IV EMUL CUSTOM SH
Status: DC | PRN
Start: 2016-12-06 — End: 2016-12-06

## 2016-12-06 MED ORDER — LABETALOL HCL 5 MG/ML IV SOLN
10 mg | INTRAVENOUS | Status: DC | PRN
Start: 2016-12-06 — End: 2016-12-06

## 2016-12-06 MED ORDER — PHARMACY CONSULT-VANCOMYCIN JX
Status: DC | PRN
Start: 2016-12-06 — End: 2016-12-07

## 2016-12-06 MED ORDER — VANCOMYCIN HCL IN NACL 2-0.9 GM/500 ML-% IV SOLN
20 mg/kg | Freq: Three times a day (TID) | INTRAVENOUS | Status: DC
Start: 2016-12-06 — End: 2016-12-07

## 2016-12-06 MED ORDER — LACTATED RINGERS IV SOLN
1000 mL | INTRAVENOUS | Status: DC
Start: 2016-12-06 — End: 2016-12-06

## 2016-12-06 MED ORDER — NICOTINE 21 MG/24HR TD PT24
1 | MEDICATED_PATCH | Freq: Every day | TRANSDERMAL | Status: DC
Start: 2016-12-06 — End: 2016-12-10

## 2016-12-06 MED ORDER — CEFAZOLIN SODIUM 1 G IJ SOLR
Status: DC | PRN
Start: 2016-12-06 — End: 2016-12-06

## 2016-12-06 MED ORDER — HYDROMORPHONE HCL 2 MG/ML IJ SOLN JX
.5 mg | INTRAVENOUS | Status: DC | PRN
Start: 2016-12-06 — End: 2016-12-06

## 2016-12-06 MED ORDER — DIPHENHYDRAMINE HCL 25 MG PO CAPS
25 mg | Freq: Four times a day (QID) | ORAL | Status: DC | PRN
Start: 2016-12-06 — End: 2016-12-10

## 2016-12-06 MED ORDER — LACTATED RINGERS IV SOLN
1000 mL | Freq: Once | INTRAVENOUS | Status: CP
Start: 2016-12-06 — End: ?

## 2016-12-06 MED ORDER — HALOPERIDOL LACTATE 5 MG/ML IJ SOLN
.5 mg | INTRAVENOUS | Status: DC | PRN
Start: 2016-12-06 — End: 2016-12-06

## 2016-12-06 MED ORDER — HYDRALAZINE HCL 20 MG/ML IJ SOLN
5 mg | INTRAVENOUS | Status: DC | PRN
Start: 2016-12-06 — End: 2016-12-06

## 2016-12-06 MED ORDER — ENOXAPARIN SODIUM 40 MG/0.4ML SC SOLN
40 mg | Freq: Every day | SUBCUTANEOUS | Status: DC
Start: 2016-12-06 — End: 2016-12-10

## 2016-12-06 MED ORDER — CLINDAMYCIN PHOSPHATE IN D5W 900 MG/50ML IV SOLN
900 mg | Freq: Three times a day (TID) | INTRAVENOUS | Status: DC
Start: 2016-12-06 — End: 2016-12-06

## 2016-12-06 MED ORDER — LIDOCAINE HCL (PF) 1 % IJ SOLN SH
INTRAVENOUS | Status: DC | PRN
Start: 2016-12-06 — End: 2016-12-06

## 2016-12-06 MED ORDER — GLUCOSE 4 G PO CHEW JX
16 g | ORAL | Status: DC | PRN
Start: 2016-12-06 — End: 2016-12-10

## 2016-12-06 MED ORDER — OXYCODONE HCL 5 MG PO TABS
10 mg | ORAL | Status: DC | PRN
Start: 2016-12-06 — End: 2016-12-06

## 2016-12-06 MED ORDER — ALUM & MAG HYDROX-SIMETH 1200-1200-120 MG/30ML PO SUSP UD
30 mL | Freq: Four times a day (QID) | ORAL | Status: DC | PRN
Start: 2016-12-06 — End: 2016-12-10

## 2016-12-06 MED ORDER — DEXTROSE 50 % IV SOLN
30 mL | INTRAVENOUS | Status: DC | PRN
Start: 2016-12-06 — End: 2016-12-10

## 2016-12-06 MED ORDER — NICOTINE PATCH REMOVAL
1 | MEDICATED_PATCH | Freq: Every day | TRANSDERMAL | Status: DC
Start: 2016-12-06 — End: 2016-12-10

## 2016-12-06 NOTE — Immediate Post-Operative Note
[facility]  Immediate Post-Operative Note      Case Record #: [id number]  Steve Garza  02/09/1991, 26 y.o., male    Case Date: 12/06/2016    Staff:  Surgeon(s) and Role:     * Cordon, Jared, MD - Resident - Assisting     * Helms, [name], MD - Primary  Anesthesiologist: Cabral, John, MD  CRNA: Davis, John W, CRNA      Pre-Op Diagnosis:   Infection [B99.9]    Procedure(s):  DEBRIDEMENT OF RIGHT INDEX FINGER (Right)    Post-Op Diagnosis Codes:     * Infection [B99.9]      Teaching MD Documentation  1. A Resident/ACGME fellow participated in this case: Yes   2. Participating Resident/ACGME fellow was qualified for case type: Yes    3. Teaching surgeon is present entire case (including opening & closing): No    4. Teach surgeon is present during key portion(s) of the procedure as well as other aspects of the case: yes   A: Key portions of the procedure were: abscess drainage   B: Surgeon was immediately available for remainder of case: yes   C: Other teaching surgeon immediately available, if any was: no   D: Portion(s) of procedure covered by other teaching surgeon were: no        Findings/Description: right index dorsal P1 abscess    Specimens: None    SCD's applied: yes    EBL: 5 mls    Dictation #[id number]

## 2016-12-06 NOTE — Nursing Post-Procedure Note
Patient fully awake and oriented X4, patient denies any nausea at this time. Patient sleeping without discomfort in stretcher.  Patient fully recovered from anesthesia, patient sign out by anesthesiologist.  Change patient to boarding in pacu while awaiting for room 442-b to be clean.

## 2016-12-06 NOTE — ED CDU H&P
26 yo M with insect bite seen in ED yesterday prescribed antibiotics, unable to fill 2/2 to cost, worsening today, [facility] sent ot CDU for ortho evaluation and IV antibiotics, ortho evaluated, IV antibiotics, enlarged incision and will continue to follow tomorrow am for evaluation and dispo.   Steve MaltaJessica M Sabin, PA-C 11:15 PM 12/05/2016    Assessment and Plan     [name] is a 26 y.o. male admitted to CDU for cellulitis and finger abscess.    Potential Intervention in CDU-OBU  IV fluids  Frequent re-assessments and documentation of clinical condition and response to therapy                   Lenn CalSabin, Jessica M, PA-C  12/05/16 2316

## 2016-12-06 NOTE — ED CDU Note
PTT 35 24 - 35 seconds     ED Course   Procedures        MDM   Decide to obtain history from someone other than the patient: No    Decide to obtain previous medical records: No    Clinical Lab Test(s): Ordered and Reviewed    Diagnostic Tests (Radiology, EKG): Ordered and Reviewed    Independent Visualization (ED US, Wet Prep, Other): No    Discussed patient with NON-ED Provider: Consultant      Assessment and Plan   [name] is a 26 y.o. male admitted to CDU for right hand cellulitis.    Potential Intervention in CDU-OBU  Monitoring for potential arrhythmias or hemodynamic issues  IV fluids  IV antibiotics  Repeat laboratories as indicated  Pain medication as needed  Ortho consultation   Frequent re-assessments and documentation of clinical condition and response to therapy    Steve SinghSherri L Roberts, PA-C 8:58 AM 12/06/2016--pt was seen by ortho and would like pt NPO for probable OR today.    Steve SinghSherri L Roberts, PA-C 9:20 AM 12/06/2016--received call from OR that they will be coming to get pt for surgery--I have paged Ortho regarding admission orders.     Steve SinghSherri L Roberts, PA-C 9:28 AM 12/06/2016--ortho requesting that pt be admitted to hospital service    Steve SinghSherri L Roberts, PA-C 9:29 AM 12/06/2016--spoke to hospital service who has agreed to admit pt.  Transportation here to take pt to the OR.    Patient Status   Patient Status:  Steve Garza          Garza, Sherri L, [place]PA-C  12/06/16 [id number]

## 2016-12-06 NOTE — Op Note
PATIENT NAMEPLEZ Garza  MRN:   [id number]  DATE OF SERVICE:  12/06/2016    ATTENDING PHYSICIAN:  Juel Burrow, MD    ASSISTANT:  Valla Leaver, MD    PREOPERATIVE DIAGNOSIS:  Right dorsal index finger abscess.    POSTOPERATIVE DIAGNOSIS:  Right dorsal index finger abscess.    PROCEDURE PERFORMED:  Irrigation and debridement of right dorsal index finger abscess.    COMPLICATIONS:  None.    ANTIBIOTICS:  2g IV Ancef given after intraoperative cultures obtained.    ESTIMATED BLOOD LOSS:  4m.    PERTINENT FINDINGS:  Purulent abscess over right index finger dorsum of P1.    INDICATION FOR PROCEDURE:  [name] a 26year old gentleman who had 5 days of worsening pain and swelling to his right index finger.  [facility] where he had a bedside I&D performed in the Emergency Department.  Due to continued concerns, [facility].  He was evaluated by the Orthopedic Surgery resident on call who repeated a bedside D&I.  Due to no improvement in his clinical exam, the decision was made to proceed with formal operative irrigation and debridement.  All risks and benefits of the procedure were explained to the patient in detail including possible neurovascular injury and hand stiffness.  He wished to proceed as soon as possible.  Informed consent was obtained.    DESCRIPTION OF PROCEDURE:  The patient was met in the preoperative holding area.  The right index finger was marked by the surgeon.  Informed consent was verified.  He was taken to the Operating Room.  A preinduction timeout was performed.  The patient was able to transfer to the operating room table in the supine position on his own.  General anesthesia was administered.  The right upper extremity was prepped and draped in normal sterile fashion.  A final timeout was performed.  The right index finger

## 2016-12-06 NOTE — Immediate Post-Operative Note
[facility]  Im[facility] Post[facility]      Case Record #: [id number]  Steve Garza  [birth year], 26 y.o., male    Case Date: 12/06/2016    Staff:  Surgeon(s) and Role:     * Mitzi Davenportordon, Jared, MD - Resident - Assisting     * Helms, Traci SermonJonathan Robert, MD - Primary  Anesthesiologist: Elease Etienneabral, John, MD  CRNA: Casimer Leekavis, John W, CRNA      Pre-Op Diagnosis:   Infection [B99.9]    Procedure(s):  DEBRIDEMENT OF RIGHT INDEX FINGER (Right)    Post-Op Diagnosis Codes:     * Infection [B99.9]      Teaching MD Documentation  1. A Resident/ACGME fellow participated in this case: Yes   2. Participating Resident/ACGME fellow was qualified for case type: Yes    3. Teaching surgeon is present entire case (including opening & closing): No    4. Teach surgeon is present during key portion(s) of the procedure as well as other aspects of the case: yes   A: Key portions of the procedure were: abscess drainage   B: Surgeon was immediately available for remainder of case: yes   C: Other teaching surgeon immediately available, if any was: no   D: Portion(s) of procedure covered by other teaching surgeon were: no        Findings/Description: right index dorsal P1 abscess    Specimens: None    SCD's applied: yes    EBL: 5 mls    Dictation #[id number]

## 2016-12-06 NOTE — Progress Notes
Compartments soft/compressible.   Pain on passive stretch of index finger, swelling not fusiform.         Ashok Cordiaody Sanderson, MD  10:16 AM  12/06/2016

## 2016-12-06 NOTE — Op Note
TEACHING SURGEON ATTESTATION: I was present or immediately avialble.      [name] Helms, MD    Dictated by: Jared Cordon, MD    JC/NTS  DD:  12/06/2016 11:33:41  DT:  12/06/2016 13:02:12  Job#:  [id number]  Conf#:  [id number]

## 2016-12-06 NOTE — Consults
Admission to follow

## 2016-12-06 NOTE — H&P
-   S/p D and I 12/06/16 - cultures sent from OR  - Ortho to re eval the Wound on POD 2   - Cont IV vancomycin started in ER  - FOllow final cultures.   - NWB RUE  - Pain control.     # Sepsis - Leucocytosis and intermittent tachycardia  - No fever,.   - Follow final wound cultures.     # DVT ppx     [facility] Center[facility].vinay Gagadam  Hospital Medicine   12/06/2016 11:38 AM

## 2016-12-06 NOTE — ED CDU Note
Physical Exam     ED Triage Vitals [12/05/16 1315]   BP 123/70   Pulse 85   Resp 16   Temp 36.6 ?C (97.9 ?F)   Temp src Oral   Height 1.778 m   Weight 101.6 kg   SpO2 97 %   BMI (Calculated) 32.21       BP 126/69  - Pulse 72  - Temp 36.4 ?C (97.5 ?F) (Oral)  - Resp 21  - Ht 1.778 m  - Wt 101.6 kg  - SpO2 99%  - BMI 32.14 kg/m?      Physical Exam   Constitutional: He is oriented to person, place, and time. He appears well-developed and well-nourished. No distress.   HENT:   Head: Normocephalic and atraumatic.   Cardiovascular: Normal rate and regular rhythm.    Pulmonary/Chest: Breath sounds normal. No respiratory distress. He has no wheezes.   Abdominal: Soft.   Musculoskeletal: He exhibits edema and tenderness.        Hands:  Edema and decreased ROM noted to all pt's fingers on his right hand.   Neurological: He is alert and oriented to person, place, and time. No cranial nerve deficit.   Skin: Skin is warm. No rash noted. He is not diaphoretic. There is erythema. No pallor.   Psychiatric: He has a normal mood and affect. His behavior is normal. Judgment and thought content normal.   Nursing note and vitals reviewed.    Per Radiology: Xr Hand Right 3 Views    Result Date: 12/05/2016  EXAMINATION: XR HAND RIGHT 3 VIEWS CLINICAL INFORMATION: Infection. TECHNIQUE: 3 views of the right hand COMPARISON: None. FINDINGS: Normal anatomic alignment. No acute fracture or subluxation. Joint spaces are preserved. There is soft tissue swelling of the second digit. No radiopaque foreign body. Evidence of old fifth metacarpal and additional old carpal fractures.     No acute fracture. Soft tissue swelling of the second digit. I personally reviewed the images and the residents findings and agree with the above. Read By - Daniel Siragusa M.D.  Electronically Verified By - Daniel Siragusa M.D.  Released Date Time - 12/05/2016 7:10 PM  Resident - Anushi Patel  Recent Results (from the past 24 hour(s))   Sedimentation Rate

## 2016-12-06 NOTE — Op Note
Dictated by: Mitzi DavenportJared Cordon, MD    JC/NTS  DD:  12/06/2016 11:33:41  DT:  12/06/2016 13:02:12  Job#:  [id number]  Conf#:  [id number]

## 2016-12-06 NOTE — Anesthesia Postprocedure Evaluation
Post Anesthesia Eval    Respiratory function appropriate( normal respiratory rate and oxygen saturation, airway patent )  Cardiovascular function appropriate( normal heart rate and blood pressure )  Mental status appropriate  Patient normothermic  Pain well controlled  No uncontrolled nausea and vomiting  Patient appropriately hydrated  No apparent anesthesia complications  Patient tolerated procedure well

## 2016-12-06 NOTE — Progress Notes
Department of Pharmacy  Adult Pharmacokinetics Consult    The pharmacy has implemented the physician approved protocol for the vancomycin pharmacokinetics consult.    Steve Garza is a 26 y.o. male.     Patient Active Problem List   Diagnosis   ? Infection   .     Objective     Weight 107.5 kg (237 lb)    Ideal Body Weight 73.0 kg      Labs     Lab Results   Component Value Date/Time    WBC 11.93 (H) 12/05/2016 03:17 PM      Lab Results   Component Value Date/Time    CREATININE 0.85 12/05/2016 03:17 PM       Lab Results   Component Value Date/Time    BUN 11 12/05/2016 03:17 PM       No results found for: VANCOTROUGH  No results found for: VANCOPEAK  No results found for: VANCORANDOM      Assessment/Plan     1. Patient is on vancomycin day 1.  2. Vancomycin is for the treatment of cellulitis/ abscess of finger including joint area.  3. Estimated Creatinine Clearance: 160.3 mL/min (by C-G formula based on SCr of 0.85 mg/dL).   4. Goal Trough: 15 - 20 mcg/ml.  5. Start vancomycin 2000 mg Q 8 hours.  6. A vancomycin trough has been ordered for Tuesday.  7. We will continue to follow, adjusting dose per vancomycin levels, SCr and BUN.                                             Thank you.    ELIZABETH EICHEL, Pharmacist    Vancomycin and Aminoglycoside Policy and Procedure

## 2016-12-06 NOTE — Nursing Post-Procedure Note
Patient fully awake and oriented X4, patient denies any nausea at this time. Patient sleeping without discomfort in stretcher.  Patient fully recovered from anesthesia, patient sign out by anesthesiologist.  Change patient to boarding in pacu while awaiting for room 442-b to be clean.

## 2016-12-06 NOTE — H&P
Vitals: BP 121/59  - Pulse 90  - Temp 36.3 ?C (97.4 ?F) (Skin)  - Resp 22  - Ht 1.778 m (5\' 10" )  - Wt 101.6 kg (224 lb)  - SpO2 91%  - BMI 32.14 kg/m? , 91%,   Intake/Output Summary (Last 24 hours) at 12/06/16 1138  Last data filed at 12/06/16 1113   Gross per 24 hour   Intake              400 ml   Output                0 ml   Net              400 ml     General:  No acute distress. Moderately built.   HEENT:  PERL, No scleral icterus, Moist mucous membranes, Oropharynx clear   Resp:  Clear to auscultation, No wheezing, rales, or rhonchi . No use of accessory muscles.   CV:  Regular rhythm, Positive S1/S2, No murmurs heard  GI:  Soft, No abdominal tenderness, Nondistended, Bowel sounds present  Extremities:  Positive pulses, No edema, No cyanosis  Right index finger and palm in post OP dressing .  Psych: no agitation/ distress.   Neuro:  Cranial nerves II-XII intact, No focal deficits  Awake alert oriented to place , time and person.   Skin:  No suspicious lesions, No rashes  Lymphatics: no cervical or axillary lymphadenopathy      Data Review     Results for orders placed or performed during the hospital encounter of 12/05/16 (from the past 48 hour(s))   CBC and Differential    Collection Time: 12/05/16  3:17 PM    Narrative    The following orders were created for panel order CBC and Differential.  Procedure                               Abnormality         Status                     ---------                               -----------         ------                     CBC with Differential pa.Marland Kitchen.Marland Kitchen.[[id number]][[id number]]  Abnormal            Final result                 Please view results for these tests on the individual orders.   Sedimentation Rate    Collection Time: 12/05/16  3:17 PM   Result Value    Sed Rate 13   BMP    Collection Time: 12/05/16  3:17 PM   Result Value    Sodium 139    Potassium 4.0    Chloride 101    CO2 25    Urea Nitrogen 11    Creatinine 0.85    BUN/Creatinine Ratio 12.9    Glucose 83

## 2016-12-06 NOTE — Progress Notes
Department of Pharmacy  Adult Pharmacokinetics Consult    The pharmacy has implemented the physician approved protocol for the vancomycin pharmacokinetics consult.    Steve Garza is a 26 y.o. male.     Patient Active Problem List   Diagnosis   ? Infection   .     Objective     Weight 107.5 kg (237 lb)    Ideal Body Weight 73.0 kg      Labs     Lab Results   Component Value Date/Time    WBC 11.93 (H) 12/05/2016 03:17 PM      Lab Results   Component Value Date/Time    CREATININE 0.85 12/05/2016 03:17 PM       Lab Results   Component Value Date/Time    BUN 11 12/05/2016 03:17 PM       No results found for: VANCOTROUGH  No results found for: VANCOPEAK  No results found for: VANCORANDOM      Assessment/Plan     1. Patient is on vancomycin day 1.  2. Vancomycin is for the treatment of cellulitis/ abscess of finger including joint area.  3. Estimated Creatinine Clearance: 160.3 mL/min (by C-G formula based on SCr of 0.85 mg/dL).   4. Goal Trough: 15 - 20 mcg/ml.  5. Start vancomycin 2000 mg Q 8 hours.  6. A vancomycin trough has been ordered for Tuesday.  7. We will continue to follow, adjusting dose per vancomycin levels, SCr and BUN.                                             Thank you.    ELIZABETH EICHEL, Pharmacist    Vancomycin and Aminoglycoside Policy and Procedure

## 2016-12-06 NOTE — Nursing Post-Procedure Note
Patient room is clean, report given to RN Rosanna who will resume care of the patient at 4S room 442-B.  Pacu tech transferring patient to 4S. Belonging X3 bags return to patient unopened.

## 2016-12-06 NOTE — ED CDU Note
Diagnostic Tests (Radiology, EKG): {SH ED JX MDM ORDERED AND REVIEWED:28124}    Independent Visualization (ED US, Wet Prep, Other): {SH ED JX MDM NO YES WILDCARD:26444}    Discussed patient with NON-ED Provider: {SH ED JX MDM - ANOTHER PROVIDER:28381}      Assessment and Plan   Mr. Mcgahee is a 26 y.o. male admitted to CDU for right hand cellulitis.    Potential Intervention in CDU-OBU  Monitoring for potential arrhythmias or hemodynamic issues  IV fluids  IV antibiotics  Repeat laboratories as indicated  Pain medication as needed  Ortho consultation   Frequent re-assessments and documentation of clinical condition and response to therapy    Sherri L Roberts, PA-C 8:58 AM 12/06/2016--pt was seen by ortho and would like pt NPO for probable OR today.    Patient Status   Patient Status:  {SH ED JX PATIENT STATUS:1602057102}

## 2016-12-06 NOTE — H&P
Calcium 9.1    Osmolality Calc 276.1    Anion Gap 13    EGFR >59   CRP High Sensitive    Collection Time: 12/05/16  3:17 PM   Result Value    CRP, High Sensitivity 31.50 (H)   CBC with Differential panel result    Collection Time: 12/05/16  3:17 PM   Result Value    WBC 11.93 (H)    RBC 4.45 (L)    Hemoglobin 13.8 (L)    Hematocrit 38.3 (L)    MCV 86.1    MCH 31.0    MCHC 36.0    RDW 12.8    Platelet Count 264    MPV 10.6    nRBC % 0.0    Absolute NRBC Count 0.00    Neutrophils % 66.8    Lymphocytes % 21.0 (L)    Monocytes % 8.5 (H)    Eosinophils % 3.2    Immature Granulocytes % 0.3    Neutrophils Absolute 7.98    Lymphocytes Absolute 2.51    Monocytes Absolute 1.01    Eosinophils Absolute 0.38    Basophil Absolute 0.02    Absolute Immature Granulocytes 0.03 (H)    Basophils % 0.2   Type and Screen    Collection Time: 12/06/16  8:38 AM   Result Value    ABO Grouping A    Rh Type Positive    Antibody Screen Negative    Specimen Expiration 2016-12-09 23:59   PT-INR    Collection Time: 12/06/16  8:39 AM   Result Value    Protime 13.4    INR 1.0   PTT    Collection Time: 12/06/16  8:39 AM   Result Value    PTT 35       Per Radiology: Xr Hand Right 3 Views    Result Date: 12/05/2016  EXAMINATION: XR HAND RIGHT 3 VIEWS CLINICAL INFORMATION: Infection. TECHNIQUE: 3 views of the right hand COMPARISON: None. FINDINGS: Normal anatomic alignment. No acute fracture or subluxation. Joint spaces are preserved. There is soft tissue swelling of the second digit. No radiopaque foreign body. Evidence of old fifth metacarpal and additional old carpal fractures.     No acute fracture. Soft tissue swelling of the second digit. I personally reviewed the images and the residents findings and agree with the above. Read By Ronelle Nigh M.D.  Electronically Verified By Ronelle Nigh M.D.  Released Date Time - 12/05/2016 7:10 PM  Resident - [place] and [facility]        # Right index Finger cellulitis-

## 2016-12-06 NOTE — Progress Notes
Department of Orthopedics   Progress Note    Admit Date: 12/05/2016    LOS: 0 days     Assessment and Plan:     26 y.o. male  with [name].    -Pain control: per primary  -Diet: per primary  -NWB R Hand  -Elevate R Hand  -PT/OT to eval and treat  -DVT ppx: per primary  -IS 10x/hr, nursing to demonstrate proper technique  -NPO, maintenance IVF     -Plan to go to OR with Ortho Trauma today for D&I R Hand       Subjective:     NAEON. Pt states he has been NPO. States that his pain and swelling are getting worse. States that his ROM is decreasing compared to yesterday in the R index finger.    Patient denies N/V, F/C, chest pain, pressure, SOB and night sweats.     Objective:     Vital Signs: Last Filed Vitals Signs: 24 Hour Range   BP: 122/78 (08/14 0610) BP: (116-128)/(62-82)    Temp: 36.8 ?C (98.2 ?F) (08/14 0610) Temp:  [36.7 ?C (98 ?F)-37.1 ?C (98.7 ?F)]    Pulse: 87 (08/14 0610) Pulse:  [87-117]    Resp: 20 (08/14 0610) Resp:  [15-20]    SpO2: 100 % (08/14 0610) SpO2:  [98 %-100 %]            Date 12/05/16 0700 - 12/06/16 0659 12/06/16 0700 - 12/07/16 0659   Shift 0700-1459 1500-2259 2300-0659 24 Hour Total 0700-1459 1500-2259 2300-0659 24 Hour Total   I  N  T  A  K  E   IV Piggyback  100  100          Volume (mL) (clindamycin (CLEOCIN) IVPB 900 mg)  100  100        Shift Total  100  100       O  U  T  P  U  T   Shift Total           NET  100  100           Physical Exam  General: Alert, oriented x 3, no acute distress  Respiratory: No increased work of breathing, symmetrical chest rise   Cardiovascular: Regular rate    Right Upper Extremity:   2+ radial pulse. <2sec cap refill   SILT present in M/U/R/A nerve distributions.    +Motor M/U/R/A/PIN/AIN nerve distributions.   2cm area of fluctuance over dorsal index P1.    Mild erythema present proximally to level of wrist.    Tender to palpation of index finger and dorsally and proximally to level of mid forearm.    No deformities or crepitus.

## 2016-12-06 NOTE — ED CDU Attestation Note
ED CDU Attestation   I saw and evaluated the patient. I reviewed and agree with the findings and plan as documented in the note. I reviewed the patient's history. I discussed this patient with the ARNP/PA.   Comments: Two day progression from abscess-cellulitis to evolving tenosynovitis dominant (R) index finger. Pending Ortho for disposition and mgmt plan. Already on parenteral antibx's.                 Rios, Luis E, MD  12/06/16 0854

## 2016-12-06 NOTE — Op Note
was noted to have an approximately 15mm area of skin necrosis with a central area that was draining purulent material.  There was erythema extending up into the dorsum of the hand.  I began by extending the incision previously used about 5mm proximal and 5mm distal to measure about 15mm in total length.  At this time, it did not appear that the prior irrigation and debridements had gone deep enough as there was still intact soft tissue underneath this incision with just a small 2mm diameter hole where the abscess was draining.  Once sharp dissection was carried down all the way to the extensor tendon, there was a large amount of purulent material measuring about 3mm to 5mm in volume.  The abscess did not track volarly into the flexor tendon sheath nor proximally or distally.  The wound was then thoroughly irrigated with 6 liters of normal saline.  Following this, there were no signs of purulence.  It was noted that the erythema over the dorsum of the hand had vastly improved leaving just the 15mm area of necrosis of the skin.  The skin had questionable viability, but did not appear frankly necrotic.  At this point, the wound was packed with quarter-inch iodoform gauze.  One 4-0 nylon simple interrupted suture was placed in the proximal aspect to decrease the size of the incision, but still allow for satisfactory drainage.  Over this dry 4 x 4 gauze were placed and the hand was wrapped and 2-inch Ioban to just include the index finger.  The patient was then awoken from anesthesia and transferred to the PACU in stable condition.  The 2 grams of IV Ancef was given after intraoperative cultures.  Once purulent material was encountered at the abscess site, 2 intraoperative cultures were obtained.    DISPOSITION:  We will continue IV antibiotics for the patient.  We will wait for culture results, and we will do a wound check in 24 to 48 hours to assess for improvement.      Sharmaine BaseJonathan Robert Helms, MD

## 2016-12-06 NOTE — ED CDU Note
Collection Time: 12/05/16  3:17 PM   Result Value Ref Range    Sed Rate 13 0 - 15 mm/hr   BMP    Collection Time: 12/05/16  3:17 PM   Result Value Ref Range    Sodium 139 135 - 145 mmol/L    Potassium 4.0 3.3 - 4.6 mmol/L    Chloride 101 101 - 110 mmol/L    CO2 25 21 - 29 mmol/L    Urea Nitrogen 11 6 - 22 mg/dL    Creatinine 0.85 0.67 - 1.17 mg/dL    BUN/Creatinine Ratio 12.9 6.0 - 22.0 (calc)    Glucose 83 71 - 99 mg/dL    Calcium 9.1 8.6 - 10.0 mg/dL    Osmolality Calc 276.1     Anion Gap 13 4 - 16 mmol/L    EGFR >59 mL/min/1.73M2   CRP High Sensitive    Collection Time: 12/05/16  3:17 PM   Result Value Ref Range    CRP, High Sensitivity 31.50 (H) 0.00 - 5.00 mg/L   CBC with Differential panel result    Collection Time: 12/05/16  3:17 PM   Result Value Ref Range    WBC 11.93 (H) 4.5 - 11 x10E3/uL    RBC 4.45 (L) 4.50 - 6.30 x10E6/uL    Hemoglobin 13.8 (L) 14.0 - 18.0 g/dL    Hematocrit 38.3 (L) 40.0 - 54.0 %    MCV 86.1 82.0 - 101.0 fl    MCH 31.0 27.0 - 34.0 pg    MCHC 36.0 31.0 - 36.0 g/dL    RDW 12.8 12.0 - 16.1 %    Platelet Count 264 140 - 440 thou/cu mm    MPV 10.6 9.5 - 11.5 fl    nRBC % 0.0 0.0 - 1.0 %    Absolute NRBC Count 0.00     Neutrophils % 66.8 34.0 - 73.0 %    Lymphocytes % 21.0 (L) 25.0 - 45.0 %    Monocytes % 8.5 (H) 2.0 - 6.0 %    Eosinophils % 3.2 1.0 - 4.0 %    Immature Granulocytes % 0.3 0.0 - 2.0 %    Neutrophils Absolute 7.98 1.80 - 8.70 x10E3/uL    Lymphocytes Absolute 2.51 x10E3/uL    Monocytes Absolute 1.01 x10E3/uL    Eosinophils Absolute 0.38 x10E3/uL    Basophil Absolute 0.02 x10E3/uL    Absolute Immature Granulocytes 0.03 (H) 0 - 0 x10E3/uL    Basophils % 0.2 0 - 1 %     ED Course   Procedures        MDM   Decide to obtain history from someone other than the patient: {SH ED [place] MDM - OBTAIN POEUMPN:36144}    Decide to obtain previous medical records: [facility] ED [place] [facility] MDM - PREVIOUS MED REC - NO RXV:40086}    Clinical Lab Test(s): {SH ED [place] [facility] MDM ORDERED AND REVIEWED:28124}

## 2016-12-06 NOTE — ED CDU Note
Physical Exam     ED Triage Vitals [12/05/16 1315]   BP 123/70   Pulse 85   Resp 16   Temp 36.6 ?C (97.9 ?F)   Temp src Oral   Height 1.778 m   Weight 101.6 kg   SpO2 97 %   BMI (Calculated) 32.21       BP 126/69  - Pulse 72  - Temp 36.4 ?C (97.5 ?F) (Oral)  - Resp 21  - Ht 1.778 m  - Wt 101.6 kg  - SpO2 99%  - BMI 32.14 kg/m?     Physical Exam   Constitutional: He is oriented to person, place, and time. He appears well-developed and well-nourished. No distress.   HENT:   Head: Normocephalic and atraumatic.   Cardiovascular: Normal rate and regular rhythm.    Pulmonary/Chest: Breath sounds normal. No respiratory distress. He has no wheezes.   Abdominal: Soft.   Musculoskeletal: He exhibits edema and tenderness.        Hands:  Edema and decreased ROM noted to all pt's fingers on his right hand.   Neurological: He is alert and oriented to person, place, and time. No cranial nerve deficit.   Skin: Skin is warm. No rash noted. He is not diaphoretic. There is erythema. No pallor.   Psychiatric: He has a normal mood and affect. His behavior is normal. Judgment and thought content normal.   Nursing note and vitals reviewed.    Per Radiology: Xr Hand Right 3 Views    Result Date: 12/05/2016  EXAMINATION: XR HAND RIGHT 3 VIEWS CLINICAL INFORMATION: Infection. TECHNIQUE: 3 views of the right hand COMPARISON: None. FINDINGS: Normal anatomic alignment. No acute fracture or subluxation. Joint spaces are preserved. There is soft tissue swelling of the second digit. No radiopaque foreign body. Evidence of old fifth metacarpal and additional old carpal fractures.     No acute fracture. Soft tissue swelling of the second digit. I personally reviewed the images and the residents findings and agree with the above. Read By - Daniel Siragusa M.D.  Electronically Verified By - Daniel Siragusa M.D.  Released Date Time - 12/05/2016 7:10 PM  Resident - Anushi Patel  Recent Results (from the past 24 hour(s))   Sedimentation Rate

## 2016-12-06 NOTE — Progress Notes
Compartments soft/compressible.   Pain on passive stretch of index finger, swelling not fusiform.         Steve Sanderson, MD  10:16 AM  12/06/2016

## 2016-12-06 NOTE — ED CDU Note
Collection Time: 12/05/16  3:17 PM   Result Value Ref Range    Sed Rate 13 0 - 15 mm/hr   BMP    Collection Time: 12/05/16  3:17 PM   Result Value Ref Range    Sodium 139 135 - 145 mmol/L    Potassium 4.0 3.3 - 4.6 mmol/L    Chloride 101 101 - 110 mmol/L    CO2 25 21 - 29 mmol/L    Urea Nitrogen 11 6 - 22 mg/dL    Creatinine 0.85 0.67 - 1.17 mg/dL    BUN/Creatinine Ratio 12.9 6.0 - 22.0 (calc)    Glucose 83 71 - 99 mg/dL    Calcium 9.1 8.6 - 10.0 mg/dL    Osmolality Calc 276.1     Anion Gap 13 4 - 16 mmol/L    EGFR >59 mL/min/1.73M2   CRP High Sensitive    Collection Time: 12/05/16  3:17 PM   Result Value Ref Range    CRP, High Sensitivity 31.50 (H) 0.00 - 5.00 mg/L   CBC with Differential panel result    Collection Time: 12/05/16  3:17 PM   Result Value Ref Range    WBC 11.93 (H) 4.5 - 11 x10E3/uL    RBC 4.45 (L) 4.50 - 6.30 x10E6/uL    Hemoglobin 13.8 (L) 14.0 - 18.0 g/dL    Hematocrit 38.3 (L) 40.0 - 54.0 %    MCV 86.1 82.0 - 101.0 fl    MCH 31.0 27.0 - 34.0 pg    MCHC 36.0 31.0 - 36.0 g/dL    RDW 12.8 12.0 - 16.1 %    Platelet Count 264 140 - 440 thou/cu mm    MPV 10.6 9.5 - 11.5 fl    nRBC % 0.0 0.0 - 1.0 %    Absolute NRBC Count 0.00     Neutrophils % 66.8 34.0 - 73.0 %    Lymphocytes % 21.0 (L) 25.0 - 45.0 %    Monocytes % 8.5 (H) 2.0 - 6.0 %    Eosinophils % 3.2 1.0 - 4.0 %    Immature Granulocytes % 0.3 0.0 - 2.0 %    Neutrophils Absolute 7.98 1.80 - 8.70 x10E3/uL    Lymphocytes Absolute 2.51 x10E3/uL    Monocytes Absolute 1.01 x10E3/uL    Eosinophils Absolute 0.38 x10E3/uL    Basophil Absolute 0.02 x10E3/uL    Absolute Immature Granulocytes 0.03 (H) 0 - 0 x10E3/uL    Basophils % 0.2 0 - 1 %     ED Course   Procedures        MDM   Decide to obtain history from someone other than the patient: No    Decide to obtain previous medical records: No    Clinical Lab Test(s): Ordered and Reviewed    Diagnostic Tests (Radiology, EKG): Ordered and Reviewed    Independent Visualization (ED [place], Wet Prep, Other): No

## 2016-12-06 NOTE — Op Note
TEACHING SURGEON ATTESTATION: I was present or immediately avialble.      Sharmaine BaseJonathan Robert Helms, MD    Dictated by: Mitzi DavenportJared Cordon, MD    JC/NTS  DD:  12/06/2016 11:33:41  DT:  12/06/2016 13:02:12  Job#:  [id number]  Conf#:  [id number]

## 2016-12-06 NOTE — Anesthesia Preprocedure Evaluation
Department of Anesthesiology  Pre-operative Evaluation Record    Patient Name: Steve Garza                  Sex: male                  Age: 26 y.o.                  MRN: [id number]     Allergies:   Patient has no known allergies.    No outpatient prescriptions have been marked as taking for the 12/05/16 encounter [facility](Hospital Encounter).       Prior Surgeries:   History reviewed. No pertinent surgical history.    Social   ETOH:       Alcohol Use   ? Yes     Comment: rarely     Tobacco:   History   Smoking Status   ? Current Every Day Smoker   ? Packs/day: 1.00   ? Types: Cigarettes   Smokeless Tobacco   ? Never Used     Drugs:   History   Drug Use No       System Evaluation and Medical History:        Physical Exam:        Anesthesia Plan:       Patient has an ASA score of 2 and has been NPO for > 8 hours. Plan for anesthesia technique is general.  With an intravenous type of induction. Anesthetic plan and risks has been discussed with the patient. The following pulse oximetry, body tempature, ETC02, NIBP and 6-lead EKG will be used for intraoperative monitoring.   Transfusion plan has been discussed with the patient. Post operative pain will be controled by IV meds.       Anesthesia Attending:   Airway: Mallampati score II.       Pulmonary: On pulmonary examination breath sounds clear to auscultation.   GCS: Patients total GCS score is 15, with 4 for eye response, with 5 for verbal response, and with 6 for motor response.I have performed a pre-anesthesia examination, evaluation, and prescribed the anesthesia plan.

## 2016-12-06 NOTE — ED CDU Note
Discussed patient with NON-ED Provider: Consultant      Assessment and Plan   [name] is a 26 y.o. male admitted to CDU for right hand cellulitis.    Potential Intervention in CDU-OBU  Monitoring for potential arrhythmias or hemodynamic issues  IV fluids  IV antibiotics  Repeat laboratories as indicated  Pain medication as needed  Ortho consultation   Frequent re-assessments and documentation of clinical condition and response to therapy    Sherri L Roberts, PA-C 8:58 AM 12/06/2016--pt was seen by ortho and would like pt NPO for probable OR today.    Sherri L Roberts, PA-C 9:20 AM 12/06/2016--received call from OR that they will be coming to get pt for surgery--I have paged Ortho regarding admission orders.     Patient Status   Patient Status:  Fair

## 2016-12-06 NOTE — H&P
-   S/p D and I 12/06/16 - cultures sent from OR  - Ortho to re eval the Wound on POD 2   - Cont IV vancomycin started in ER  - FOllow final cultures.   - NWB RUE  - Pain control.     # Sepsis - Leucocytosis and intermittent tachycardia  - No fever,.   - Follow final wound cultures.     # DVT ppx     Dr.vinay Gagadam  Hospital Medicine   12/06/2016 11:38 AM

## 2016-12-06 NOTE — ED CDU Note
Discussed patient with NON-ED Provider: Consultant      Assessment and Plan   [name] is a 26 y.o. male admitted to CDU for right hand cellulitis.    Potential Intervention in CDU-OBU  Monitoring for potential arrhythmias or hemodynamic issues  IV fluids  IV antibiotics  Repeat laboratories as indicated  Pain medication as needed  Ortho consultation   Frequent re-assessments and documentation of clinical condition and response to therapy    Alvera SinghSherri L Roberts, PA-C 8:58 AM 12/06/2016--pt was seen by ortho and would like pt NPO for probable OR today.    Alvera SinghSherri L Roberts, PA-C 9:20 AM 12/06/2016--received call from OR that they will be coming to get pt for surgery--I have paged Ortho regarding admission orders.     Patient Status   Patient Status:  Fair

## 2016-12-06 NOTE — ED CDU H&P
Social History Main Topics   ? Smoking status: Current Every Day Smoker     Packs/day: 1.00     Types: Cigarettes   ? Smokeless tobacco: Never Used   ? Alcohol use Yes      Comment: rarely   ? Drug use: No   ? Sexual activity: Not Asked     Other Topics Concern   ? None     Social History Narrative   ? None       Review of Systems   Constitutional: Negative for fever.   Gastrointestinal: Negative for nausea and vomiting.   Skin: Positive for color change and wound.   All other systems reviewed and are negative.      Physical Exam     ED Triage Vitals [12/05/16 1315]   BP 123/70   Pulse 85   Resp 16   Temp 36.6 ?C (97.9 ?F)   Temp src Oral   Height 1.778 m   Weight 101.6 kg   SpO2 97 %   BMI (Calculated) 32.21           Physical Exam   Constitutional: He is oriented to person, place, and time. He appears well-developed and well-nourished. No distress.   HENT:   Head: Normocephalic and atraumatic.   Right Ear: External ear normal.   Left Ear: External ear normal.   Nose: Nose normal.   Eyes: Conjunctivae and EOM are normal. Pupils are equal, round, and reactive to light. Right eye exhibits no discharge. Left eye exhibits no discharge. No scleral icterus.   Neck: Normal range of motion. No tracheal deviation present.   Cardiovascular: Normal rate, regular rhythm, normal heart sounds and intact distal pulses.  Exam reveals no gallop and no friction rub.    No murmur heard.  Pulmonary/Chest: Effort normal and breath sounds normal. No stridor. No respiratory distress. He has no wheezes. He has no rales. He exhibits no tenderness.   Musculoskeletal: Normal range of motion.        Hands:  Neurological: He is alert and oriented to person, place, and time.   Skin: Skin is warm and dry. No rash noted. He is not diaphoretic. There is erythema. No pallor.   Psychiatric: He has a normal mood and affect. His behavior is normal. Judgment and thought content normal.   Nursing note and vitals reviewed.      ED Course   Procedures

## 2016-12-06 NOTE — ED CDU Attestation Note
ED CDU Attestation   I saw and evaluated the patient. I reviewed and agree with the findings and plan as documented in the note. I reviewed the patient's history. I discussed this patient with the ARNP/PA.   Comments: Two day progression from abscess-cellulitis to evolving tenosynovitis dominant (R) index finger. Pending Ortho for disposition and mgmt plan. Already on parenteral antibx's.                 Bennie Pieriniios, Luis E, [place]  12/06/16 [phone]

## 2016-12-06 NOTE — Nursing Post-Procedure Note
Patient room is clean, report given to RN Rosanna who will resume care of the patient at 4S room 442-B.  Pacu tech transferring patient to 4S. Belonging X3 bags return to patient unopened.

## 2016-12-06 NOTE — H&P
Vitals: BP 121/59  - Pulse 90  - Temp 36.3 ?C (97.4 ?F) (Skin)  - Resp 22  - Ht 1.778 m (5' 10")  - Wt 101.6 kg (224 lb)  - SpO2 91%  - BMI 32.14 kg/m? , 91%,   Intake/Output Summary (Last 24 hours) at 12/06/16 1138  Last data filed at 12/06/16 1113   Gross per 24 hour   Intake              400 ml   Output                0 ml   Net              400 ml     General:  No acute distress. Moderately built.   HEENT:  PERL, No scleral icterus, Moist mucous membranes, Oropharynx clear   Resp:  Clear to auscultation, No wheezing, rales, or rhonchi . No use of accessory muscles.   CV:  Regular rhythm, Positive S1/S2, No murmurs heard  GI:  Soft, No abdominal tenderness, Nondistended, Bowel sounds present  Extremities:  Positive pulses, No edema, No cyanosis  Right index finger and palm in post OP dressing .  Psych: no agitation/ distress.   Neuro:  Cranial nerves II-XII intact, No focal deficits  Awake alert oriented to place , time and person.   Skin:  No suspicious lesions, No rashes  Lymphatics: no cervical or axillary lymphadenopathy      Data Review     Results for orders placed or performed during the hospital encounter of 12/05/16 (from the past 48 hour(s))   CBC and Differential    Collection Time: 12/05/16  3:17 PM    Narrative    The following orders were created for panel order CBC and Differential.  Procedure                               Abnormality         Status                     ---------                               -----------         ------                     CBC with Differential pa...[[id number]]  Abnormal            Final result                 Please view results for these tests on the individual orders.   Sedimentation Rate    Collection Time: 12/05/16  3:17 PM   Result Value    Sed Rate 13   BMP    Collection Time: 12/05/16  3:17 PM   Result Value    Sodium 139    Potassium 4.0    Chloride 101    CO2 25    Urea Nitrogen 11    Creatinine 0.85    BUN/Creatinine Ratio 12.9    Glucose 83

## 2016-12-06 NOTE — ED CDU H&P
History     Chief Complaint   Patient presents with   ? Cellulitis       The primary encounter diagnosis was Cellulitis of finger of right hand. Diagnoses of Right hand pain, Right wrist pain, and Cellulitis of hand were also pertinent to this visit.    26 yo M [place] with c/o abscess to R index finger x 2 days after suspected spider bite. [place] and given Rx for Keflex and clindamycin. Patient notes increased swelling, redness, and pain to finger. Denies any drainage. No fever or N/V. States unable to afford ABx, worsened today.      The history is provided by the patient. No language interpreter was used.   Abscess   Location:  Finger  Finger abscess location:  R index finger  Abscess quality: painful and redness    Duration:  2 days  Progression:  Worsening  Pain details:     Quality:  Throbbing and tightness    Severity:  Moderate    Timing:  Constant    Progression:  Worsening  Chronicity:  New  Associated symptoms: no fever, no nausea and no vomiting        No Known Allergies    Patient's Medications   New Prescriptions    No medications on file   Previous Medications    No medications on file   Modified Medications    No medications on file   Discontinued Medications    CEPHALEXIN (KEFLEX) 500 MG PO CAPSULE    Take 1 capsule by mouth 4 times daily for 5 days.    CLINDAMYCIN (CLEOCIN) 150 MG PO CAPSULE    Take 2 capsules by mouth 3 times daily for 5 days.    NAPROXEN (NAPROSYN) 500 MG PO TABLET    Take 1 tablet by mouth 2 times daily as needed for other (pain).    TRAMADOL (ULTRAM) 50 MG PO TABLET    Take 1 tablet by mouth every 6 hours as needed for pain.       Past Medical History:   Diagnosis Date   ? High cholesterol        History reviewed. No pertinent surgical history.    History reviewed. No pertinent family history.    Social History     Social History   ? Marital status: Married     Spouse name: N/A   ? Number of children: N/A   ? Years of education: N/A

## 2016-12-06 NOTE — ED CDU Note
Collection Time: 12/05/16  3:17 PM   Result Value Ref Range    Sed Rate 13 0 - 15 mm/hr   BMP    Collection Time: 12/05/16  3:17 PM   Result Value Ref Range    Sodium 139 135 - 145 mmol/L    Potassium 4.0 3.3 - 4.6 mmol/L    Chloride 101 101 - 110 mmol/L    CO2 25 21 - 29 mmol/L    Urea Nitrogen 11 6 - 22 mg/dL    Creatinine 0.85 0.67 - 1.17 mg/dL    BUN/Creatinine Ratio 12.9 6.0 - 22.0 (calc)    Glucose 83 71 - 99 mg/dL    Calcium 9.1 8.6 - 10.0 mg/dL    Osmolality Calc 276.1     Anion Gap 13 4 - 16 mmol/L    EGFR >59 mL/min/1.73M2   CRP High Sensitive    Collection Time: 12/05/16  3:17 PM   Result Value Ref Range    CRP, High Sensitivity 31.50 (H) 0.00 - 5.00 mg/L   CBC with Differential panel result    Collection Time: 12/05/16  3:17 PM   Result Value Ref Range    WBC 11.93 (H) 4.5 - 11 x10E3/uL    RBC 4.45 (L) 4.50 - 6.30 x10E6/uL    Hemoglobin 13.8 (L) 14.0 - 18.0 g/dL    Hematocrit 38.3 (L) 40.0 - 54.0 %    MCV 86.1 82.0 - 101.0 fl    MCH 31.0 27.0 - 34.0 pg    MCHC 36.0 31.0 - 36.0 g/dL    RDW 12.8 12.0 - 16.1 %    Platelet Count 264 140 - 440 thou/cu mm    MPV 10.6 9.5 - 11.5 fl    nRBC % 0.0 0.0 - 1.0 %    Absolute NRBC Count 0.00     Neutrophils % 66.8 34.0 - 73.0 %    Lymphocytes % 21.0 (L) 25.0 - 45.0 %    Monocytes % 8.5 (H) 2.0 - 6.0 %    Eosinophils % 3.2 1.0 - 4.0 %    Immature Granulocytes % 0.3 0.0 - 2.0 %    Neutrophils Absolute 7.98 1.80 - 8.70 x10E3/uL    Lymphocytes Absolute 2.51 x10E3/uL    Monocytes Absolute 1.01 x10E3/uL    Eosinophils Absolute 0.38 x10E3/uL    Basophil Absolute 0.02 x10E3/uL    Absolute Immature Granulocytes 0.03 (H) 0 - 0 x10E3/uL    Basophils % 0.2 0 - 1 %   Type and Screen    Collection Time: 12/06/16  8:38 AM   Result Value Ref Range    ABO Grouping A     Rh Type Positive    PT-INR    Collection Time: 12/06/16  8:39 AM   Result Value Ref Range    Protime 13.4 11.9 - 14.3 seconds    INR 1.0 0.9 - 1.1   PTT    Collection Time: 12/06/16  8:39 AM   Result Value Ref Range

## 2016-12-06 NOTE — Op Note
was noted to have an approximately 15mm area of skin necrosis with a central area that was draining purulent material.  There was erythema extending up into the dorsum of the hand.  I began by extending the incision previously used about 5mm proximal and 5mm distal to measure about 15mm in total length.  At this time, it did not appear that the prior irrigation and debridements had gone deep enough as there was still intact soft tissue underneath this incision with just a small 2mm diameter hole where the abscess was draining.  Once sharp dissection was carried down all the way to the extensor tendon, there was a large amount of purulent material measuring about 3mm to 5mm in volume.  The abscess did not track volarly into the flexor tendon sheath nor proximally or distally.  The wound was then thoroughly irrigated with 6 liters of normal saline.  Following this, there were no signs of purulence.  It was noted that the erythema over the dorsum of the hand had vastly improved leaving just the 15mm area of necrosis of the skin.  The skin had questionable viability, but did not appear frankly necrotic.  At this point, the wound was packed with quarter-inch iodoform gauze.  One 4-0 nylon simple interrupted suture was placed in the proximal aspect to decrease the size of the incision, but still allow for satisfactory drainage.  Over this dry 4 x 4 gauze were placed and the hand was wrapped and 2-inch Ioban to just include the index finger.  The patient was then awoken from anesthesia and transferred to the PACU in stable condition.  The 2 grams of IV Ancef was given after intraoperative cultures.  Once purulent material was encountered at the abscess site, 2 intraoperative cultures were obtained.    DISPOSITION:  We will continue IV antibiotics for the patient.  We will wait for culture results, and we will do a wound check in 24 to 48 hours to assess for improvement.

## 2016-12-06 NOTE — ED CDU Note
Diagnostic Tests (Radiology, EKG): {SH ED Lamonte SakaiJX MDM ORDERED AND REVIEWED:28124}    Independent Visualization (ED US, Wet Prep, Other): {SH ED Lamonte SakaiJX MDM NO YES ZOXWRUEA:54098}WILDCARD:26444}    Discussed patient with NON-ED Provider: {SH ED Lamonte SakaiJX MDM - ANOTHER PROVIDER:28381}      Assessment and Plan   [name] is a 26 y.o. male admitted to CDU for right hand cellulitis.    Potential Intervention in CDU-OBU  Monitoring for potential arrhythmias or hemodynamic issues  IV fluids  IV antibiotics  Repeat laboratories as indicated  Pain medication as needed  Ortho consultation   Frequent re-assessments and documentation of clinical condition and response to therapy    Steve SinghSherri L Roberts, PA-C 8:58 AM 12/06/2016--pt was seen by ortho and would like pt NPO for probable OR today.    Patient Status   Patient Status:  {SH ED JX PATIENT STATUS:[phone]}

## 2016-12-06 NOTE — ED CDU Note
Physical Exam     ED Triage Vitals [12/05/16 1315]   BP 123/70   Pulse 85   Resp 16   Temp 36.6 ?C (97.9 ?F)   Temp src Oral   Height 1.778 m   Weight 101.6 kg   SpO2 97 %   BMI (Calculated) 32.21       BP 126/69  - Pulse 72  - Temp 36.4 ?C (97.5 ?F) (Oral)  - Resp 21  - Ht 1.778 m  - Wt 101.6 kg  - SpO2 99%  - BMI 32.14 kg/m?      Physical Exam   Constitutional: He is oriented to person, place, and time. He appears well-developed and well-nourished. No distress.   HENT:   Head: Normocephalic and atraumatic.   Cardiovascular: Normal rate and regular rhythm.    Pulmonary/Chest: Breath sounds normal. No respiratory distress. He has no wheezes.   Abdominal: Soft.   Musculoskeletal: He exhibits edema and tenderness.        Hands:  Edema and decreased ROM noted to all pt's fingers on his right hand.   Neurological: He is alert and oriented to person, place, and time. No cranial nerve deficit.   Skin: Skin is warm. No rash noted. He is not diaphoretic. There is erythema. No pallor.   Psychiatric: He has a normal mood and affect. His behavior is normal. Judgment and thought content normal.   Nursing note and vitals reviewed.    Per Radiology: Xr Hand Right 3 Views    Result Date: 12/05/2016  EXAMINATION: XR HAND RIGHT 3 VIEWS CLINICAL INFORMATION: Infection. TECHNIQUE: 3 views of the right hand COMPARISON: None. FINDINGS: Normal anatomic alignment. No acute fracture or subluxation. Joint spaces are preserved. There is soft tissue swelling of the second digit. No radiopaque foreign body. Evidence of old fifth metacarpal and additional old carpal fractures.     No acute fracture. Soft tissue swelling of the second digit. I personally reviewed the images and the residents findings and agree with the above. Read By Shirlee More- Daniel Siragusa M.D.  Electronically Verified By Shirlee More- Daniel Siragusa M.D.  Released Date Time - 12/05/2016 7:10 PM  Resident - Lora PaulaAnushi Patel  Recent Results (from the past 24 hour(s))   Sedimentation Rate

## 2016-12-06 NOTE — Op Note
was noted to have an approximately 15mm area of skin necrosis with a central area that was draining purulent material.  There was erythema extending up into the dorsum of the hand.  I began by extending the incision previously used about 5mm proximal and 5mm distal to measure about 15mm in total length.  At this time, it did not appear that the prior irrigation and debridements had gone deep enough as there was still intact soft tissue underneath this incision with just a small 2mm diameter hole where the abscess was draining.  Once sharp dissection was carried down all the way to the extensor tendon, there was a large amount of purulent material measuring about 3mm to 5mm in volume.  The abscess did not track volarly into the flexor tendon sheath nor proximally or distally.  The wound was then thoroughly irrigated with 6 liters of normal saline.  Following this, there were no signs of purulence.  It was noted that the erythema over the dorsum of the hand had vastly improved leaving just the 15mm area of necrosis of the skin.  The skin had questionable viability, but did not appear frankly necrotic.  At this point, the wound was packed with quarter-inch iodoform gauze.  One 4-0 nylon simple interrupted suture was placed in the proximal aspect to decrease the size of the incision, but still allow for satisfactory drainage.  Over this dry 4 x 4 gauze were placed and the hand was wrapped and 2-inch Ioban to just include the index finger.  The patient was then awoken from anesthesia and transferred to the PACU in stable condition.  The 2 grams of IV Ancef was given after intraoperative cultures.  Once purulent material was encountered at the abscess site, 2 intraoperative cultures were obtained.    DISPOSITION:  We will continue IV antibiotics for the patient.  We will wait for culture results, and we will do a wound check in 24 to 48 hours to assess for improvement.      Steve Robert Helms, MD

## 2016-12-06 NOTE — ED CDU Note
PTT 35 24 - 35 seconds     ED Course   Procedures        MDM   Decide to obtain history from someone other than the patient: No    Decide to obtain previous medical records: No    Clinical Lab Test(s): Ordered and Reviewed    Diagnostic Tests (Radiology, EKG): Ordered and Reviewed    Independent Visualization (ED US, Wet Prep, Other): No    Discussed patient with NON-ED Provider: Consultant      Assessment and Plan   Mr. Rockholt is a 26 y.o. male admitted to CDU for right hand cellulitis.    Potential Intervention in CDU-OBU  Monitoring for potential arrhythmias or hemodynamic issues  IV fluids  IV antibiotics  Repeat laboratories as indicated  Pain medication as needed  Ortho consultation   Frequent re-assessments and documentation of clinical condition and response to therapy    Sherri L Roberts, PA-C 8:58 AM 12/06/2016--pt was seen by ortho and would like pt NPO for probable OR today.    Sherri L Roberts, PA-C 9:20 AM 12/06/2016--received call from OR that they will be coming to get pt for surgery--I have paged Ortho regarding admission orders.     Sherri L Roberts, PA-C 9:28 AM 12/06/2016--ortho requesting that pt be admitted to hospital service    Sherri L Roberts, PA-C 9:29 AM 12/06/2016--spoke to hospital service who has agreed to admit pt.  Transportation here to take pt to the OR.    Patient Status   Patient Status:  Fair          Roberts, Sherri L, PA-C  12/06/16 0937

## 2016-12-06 NOTE — ED CDU Note
Discussed patient with NON-ED Provider: Consultant      Assessment and Plan   [name] is a 26 y.o. male admitted to CDU for right hand cellulitis.    Potential Intervention in CDU-OBU  Monitoring for potential arrhythmias or hemodynamic issues  IV fluids  IV antibiotics  Repeat laboratories as indicated  Pain medication as needed  Ortho consultation   Frequent re-assessments and documentation of clinical condition and response to therapy    Alvera SinghSherri L Roberts, PA-C 8:58 AM 12/06/2016--pt was seen by ortho and would like pt NPO for probable OR today.    Alvera SinghSherri L Roberts, PA-C 9:20 AM 12/06/2016--received call from OR that they will be coming to get pt for surgery--I have paged Ortho regarding admission orders.     Alvera SinghSherri L Roberts, PA-C 9:28 AM 12/06/2016--ortho requesting that pt be admitted to hospital service    Alvera SinghSherri L Roberts, PA-C 9:29 AM 12/06/2016--spoke to hospital service who has agreed to admit pt.  Transportation here to take pt to the OR.    Patient Status   Patient Status:  Fair

## 2016-12-06 NOTE — ED CDU H&P
26 yo M with insect bite seen in ED yesterday prescribed antibiotics, unable to fill 2/2 to cost, worsening today, [facility]D [facility], sent ot CDU for ortho evaluation and IV antibiotics, ortho evaluated, IV antibiotics, enlarged incision and will continue to follow tomorrow am for evaluation and dispo.   Jessica M Sabin, PA-C 11:15 PM 12/05/2016    Assessment and Plan     Mr. Daponte is a 26 y.o. male admitted to CDU for cellulitis and finger abscess.    Potential Intervention in CDU-OBU  IV fluids  Frequent re-assessments and documentation of clinical condition and response to therapy                   Sabin, Jessica M, PA-C  12/05/16 2316

## 2016-12-06 NOTE — ED CDU Note
Discussed patient with NON-ED Provider: Consultant      Assessment and Plan   [name] is a 26 y.o. male admitted to CDU for right hand cellulitis.    Potential Intervention in CDU-OBU  Monitoring for potential arrhythmias or hemodynamic issues  IV fluids  IV antibiotics  Repeat laboratories as indicated  Pain medication as needed  Ortho consultation   Frequent re-assessments and documentation of clinical condition and response to therapy    Sherri L Roberts, PA-C 8:58 AM 12/06/2016--pt was seen by ortho and would like pt NPO for probable OR today.    Sherri L Roberts, PA-C 9:20 AM 12/06/2016--received call from OR that they will be coming to get pt for surgery--I have paged Ortho regarding admission orders.     Sherri L Roberts, PA-C 9:28 AM 12/06/2016--ortho requesting that pt be admitted to hospital service    Sherri L Roberts, PA-C 9:29 AM 12/06/2016--spoke to hospital service who has agreed to admit pt.  Transportation here to take pt to the OR.    Patient Status   Patient Status:  Fair

## 2016-12-06 NOTE — ED CDU Note
Physical Exam     ED Triage Vitals [12/05/16 1315]   BP 123/70   Pulse 85   Resp 16   Temp 36.6 ?C (97.9 ?F)   Temp src Oral   Height 1.778 m   Weight 101.6 kg   SpO2 97 %   BMI (Calculated) 32.21       BP 126/69  - Pulse 72  - Temp 36.4 ?C (97.5 ?F) (Oral)  - Resp 21  - Ht 1.778 m  - Wt 101.6 kg  - SpO2 99%  - BMI 32.14 kg/m?     Physical Exam   Constitutional: He is oriented to person, place, and time. He appears well-developed and well-nourished. No distress.   HENT:   Head: Normocephalic and atraumatic.   Cardiovascular: Normal rate and regular rhythm.    Pulmonary/Chest: Breath sounds normal. No respiratory distress. He has no wheezes.   Abdominal: Soft.   Musculoskeletal: He exhibits edema and tenderness.        Hands:  Edema and decreased ROM noted to all pt's fingers on his right hand.   Neurological: He is alert and oriented to person, place, and time. No cranial nerve deficit.   Skin: Skin is warm. No rash noted. He is not diaphoretic. There is erythema. No pallor.   Psychiatric: He has a normal mood and affect. His behavior is normal. Judgment and thought content normal.   Nursing note and vitals reviewed.    Per Radiology: Xr Hand Right 3 Views    Result Date: 12/05/2016  EXAMINATION: XR HAND RIGHT 3 VIEWS CLINICAL INFORMATION: Infection. TECHNIQUE: 3 views of the right hand COMPARISON: None. FINDINGS: Normal anatomic alignment. No acute fracture or subluxation. Joint spaces are preserved. There is soft tissue swelling of the second digit. No radiopaque foreign body. Evidence of old fifth metacarpal and additional old carpal fractures.     No acute fracture. Soft tissue swelling of the second digit. I personally reviewed the images and the residents findings and agree with the above. Read By Shirlee More- Daniel Siragusa M.D.  Electronically Verified By Shirlee More- Daniel Siragusa M.D.  Released Date Time - 12/05/2016 7:10 PM  Resident - Lora PaulaAnushi Patel  Recent Results (from the past 24 hour(s))   Sedimentation Rate

## 2016-12-06 NOTE — Progress Notes
Department of Orthopedics   Progress Note    Admit Date: 12/05/2016    LOS: 0 days     Assessment and Plan:     26 y.o. male  with [name].    -Pain control: per primary  -Diet: per primary  -NWB R Hand  -Elevate R Hand  -PT/OT to eval and treat  -DVT ppx: per primary  -IS 10x/hr, nursing to demonstrate proper technique  -NPO, maintenance IVF     -Plan to go to OR with Ortho Trauma today for D&I R Hand       Subjective:     NAEON. Pt states he has been NPO. States that his pain and swelling are getting worse. States that his ROM is decreasing compared to yesterday in the R index finger.    Patient denies N/V, F/C, chest pain, pressure, SOB and night sweats.     Objective:     Vital Signs: Last Filed Vitals Signs: 24 Hour Range   BP: 122/78 (08/14 0610) BP: (116-128)/(62-82)    Temp: 36.8 ?C (98.2 ?F) (08/14 0610) Temp:  [36.7 ?C (98 ?F)-37.1 ?C (98.7 ?F)]    Pulse: 87 (08/14 0610) Pulse:  [87-117]    Resp: 20 (08/14 0610) Resp:  [15-20]    SpO2: 100 % (08/14 0610) SpO2:  [98 %-100 %]            Date 12/05/16 0700 - 12/06/16 0659 12/06/16 0700 - 12/07/16 0659   Shift 0700-1459 1500-2259 2300-0659 24 Hour Total 0700-1459 1500-2259 2300-0659 24 Hour Total   I  N  T  A  K  E   IV Piggyback  100  100          Volume (mL) (clindamycin (CLEOCIN) IVPB 900 mg)  100  100        Shift Total  100  100       O  U  T  P  U  T   Shift Total           NET  100  100           Physical Exam  General: Alert, oriented x 3, no acute distress  Respiratory: No increased work of breathing, symmetrical chest rise   Cardiovascular: Regular rate    Right Upper Extremity:   2+ radial pulse. <2sec cap refill   SILT present in M/U/R/A nerve distributions.    +Motor M/U/R/A/PIN/AIN nerve distributions.   2cm area of fluctuance over dorsal index P1.    Mild erythema present proximally to level of wrist.    Tender to palpation of index finger and dorsally and proximally to level of mid forearm.    No deformities or crepitus.

## 2016-12-06 NOTE — ED CDU H&P
Social History Main Topics   ? Smoking status: Current Every Day Smoker     Packs/day: 1.00     Types: Cigarettes   ? Smokeless tobacco: Never Used   ? Alcohol use Yes      Comment: rarely   ? Drug use: No   ? Sexual activity: Not Asked     Other Topics Concern   ? None     Social History Narrative   ? None       Review of Systems   Constitutional: Negative for fever.   Gastrointestinal: Negative for nausea and vomiting.   Skin: Positive for color change and wound.   All other systems reviewed and are negative.      Physical Exam     ED Triage Vitals [12/05/16 1315]   BP 123/70   Pulse 85   Resp 16   Temp 36.6 ?C (97.9 ?F)   Temp src Oral   Height 1.778 m   Weight 101.6 kg   SpO2 97 %   BMI (Calculated) 32.21           Physical Exam   Constitutional: He is oriented to person, place, and time. He appears well-developed and well-nourished. No distress.   HENT:   Head: Normocephalic and atraumatic.   Right Ear: External ear normal.   Left Ear: External ear normal.   Nose: Nose normal.   Eyes: Conjunctivae and EOM are normal. Pupils are equal, round, and reactive to light. Right eye exhibits no discharge. Left eye exhibits no discharge. No scleral icterus.   Neck: Normal range of motion. No tracheal deviation present.   Cardiovascular: Normal rate, regular rhythm, normal heart sounds and intact distal pulses.  Exam reveals no gallop and no friction rub.    No murmur heard.  Pulmonary/Chest: Effort normal and breath sounds normal. No stridor. No respiratory distress. He has no wheezes. He has no rales. He exhibits no tenderness.   Musculoskeletal: Normal range of motion.        Hands:  Neurological: He is alert and oriented to person, place, and time.   Skin: Skin is warm and dry. No rash noted. He is not diaphoretic. There is erythema. No pallor.   Psychiatric: He has a normal mood and affect. His behavior is normal. Judgment and thought content normal.   Nursing note and vitals reviewed.      ED Course   Procedures

## 2016-12-06 NOTE — H&P
Department of Medicine  Hospitalist Service        Admit Date and Time: 12/05/2016 12:59 PM     Subjective     Chief Complaint: right index finger swelling.     History of Present Illness:  Steve Garza is a 26 y.o. male who presented complaining of right index finger swelling and pain . It started on Thursday and thinks its a spide bite. The swelling along with the redness quickly worsened to the hand . He had difficulty moving the finger and the wrist. Denies any trauma, fever, chills.   Not a smoker, not a diabetic.     In ER patient was eval by orthopedics and emergently taken to OR for D and I .     Past medical history - reviewed and negative  Past surgical history - reviewed and negative.   Family history reviewed and negative for heart and lung disease.     Social History     Social History   ? Marital status: Married     Spouse name: N/A   ? Number of children: N/A   ? Years of education: N/A     Occupational History   ? Not on file.     Social History Main Topics   ? Smoking status: Current Every Day Smoker     Packs/day: 1.00     Types: Cigarettes   ? Smokeless tobacco: Never Used   ? Alcohol use Yes      Comment: rarely   ? Drug use: No   ? Sexual activity: Not on file     Other Topics Concern   ? Not on file     Social History Narrative   ? No narrative on file       Home Medications:  None           Allergies:  No Known Allergies    Review of Systems:  Review of system are positive for right index finger and hand swelling, spider bite.   Negative for fever, chills, SOB, headache, bowel or bladder disturbance.   Pertinent positives and negatives other than those mentioned above are reviewed and negative      Objective     Vital Signs:   Vitals:    12/06/16 0821 12/06/16 0824 12/06/16 1124 12/06/16 1130   BP: 126/69  117/58 121/59   Pulse:  72 105 90   Resp:  21 18 22   Temp:  36.4 ?C (97.5 ?F) 36.3 ?C (97.4 ?F)    TempSrc:  Oral Skin    SpO2:  99% (!) 85% 91%   Weight:       Height:         Physical Exam:

## 2016-12-06 NOTE — Op Note
was noted to have an approximately 15mm area of skin necrosis with a central area that was draining purulent material.  There was erythema extending up into the dorsum of the hand.  I began by extending the incision previously used about 5mm proximal and 5mm distal to measure about 15mm in total length.  At this time, it did not appear that the prior irrigation and debridements had gone deep enough as there was still intact soft tissue underneath this incision with just a small 2mm diameter hole where the abscess was draining.  Once sharp dissection was carried down all the way to the extensor tendon, there was a large amount of purulent material measuring about 3mm to 5mm in volume.  The abscess did not track volarly into the flexor tendon sheath nor proximally or distally.  The wound was then thoroughly irrigated with 6 liters of normal saline.  Following this, there were no signs of purulence.  It was noted that the erythema over the dorsum of the hand had vastly improved leaving just the 15mm area of necrosis of the skin.  The skin had questionable viability, but did not appear frankly necrotic.  At this point, the wound was packed with quarter-inch iodoform gauze.  One 4-0 nylon simple interrupted suture was placed in the proximal aspect to decrease the size of the incision, but still allow for satisfactory drainage.  Over this dry 4 x 4 gauze were placed and the hand was wrapped and 2-inch Ioban to just include the index finger.  The patient was then awoken from anesthesia and transferred to the PACU in stable condition.  The 2 grams of IV Ancef was given after intraoperative cultures.  Once purulent material was encountered at the abscess site, 2 intraoperative cultures were obtained.    DISPOSITION:  We will continue IV antibiotics for the patient.  We will wait for culture results, and we will do a wound check in 24 to 48 hours to assess for improvement.

## 2016-12-06 NOTE — Op Note
Dictated by: Jared Cordon, MD    JC/NTS  DD:  12/06/2016 11:33:41  DT:  12/06/2016 13:02:12  Job#:  [id number]  Conf#:  [id number]

## 2016-12-06 NOTE — ED CDU H&P
History     Chief Complaint   Patient presents with   ? Cellulitis       The primary encounter diagnosis was Cellulitis of finger of right hand. Diagnoses of Right hand pain, Right wrist pain, and Cellulitis of hand were also pertinent to this visit.    26 yo [facility] with c/o abscess to R index finger x 2 days after suspected spider bite. Seen in the [facility] yesterday and given Rx for Keflex and clindamycin. Patient notes increased swelling, redness, and pain to finger. Denies any drainage. No fever or N/V. States unable to afford ABx, worsened today.      The history is provided by the patient. No language interpreter was used.   Abscess   Location:  Finger  Finger abscess location:  R index finger  Abscess quality: painful and redness    Duration:  2 days  Progression:  Worsening  Pain details:     Quality:  Throbbing and tightness    Severity:  Moderate    Timing:  Constant    Progression:  Worsening  Chronicity:  New  Associated symptoms: no fever, no nausea and no vomiting        No Known Allergies    Patient's Medications   New Prescriptions    No medications on file   Previous Medications    No medications on file   Modified Medications    No medications on file   Discontinued Medications    CEPHALEXIN (KEFLEX) 500 MG PO CAPSULE    Take 1 capsule by mouth 4 times daily for 5 days.    CLINDAMYCIN (CLEOCIN) 150 MG PO CAPSULE    Take 2 capsules by mouth 3 times daily for 5 days.    NAPROXEN (NAPROSYN) 500 MG PO TABLET    Take 1 tablet by mouth 2 times daily as needed for other (pain).    TRAMADOL (ULTRAM) 50 MG PO TABLET    Take 1 tablet by mouth every 6 hours as needed for pain.       Past Medical History:   Diagnosis Date   ? High cholesterol        History reviewed. No pertinent surgical history.    History reviewed. No pertinent family history.    Social History     Social History   ? Marital status: Married     Spouse name: N/A   ? Number of children: N/A   ? Years of education: N/A

## 2016-12-06 NOTE — H&P
Department of Medicine  Hospitalist Service        Admit Date and Time: 12/05/2016 12:59 PM     Subjective     Chief Complaint: right index finger swelling.     History of Present Illness:  Steve Garza is a 26 y.o. male who presented complaining of right index finger swelling and pain . It started on Thursday and thinks its a spide bite. The swelling along with the redness quickly worsened to the hand . He had difficulty moving the finger and the wrist. Denies any trauma, fever, chills.   Not a smoker, not a diabetic.     In ER patient was eval by orthopedics and emergently taken to OR for D and I .     Past medical history - reviewed and negative  Past surgical history - reviewed and negative.   Family history reviewed and negative for heart and lung disease.     Social History     Social History   ? Marital status: Married     Spouse name: N/A   ? Number of children: N/A   ? Years of education: N/A     Occupational History   ? Not on file.     Social History Main Topics   ? Smoking status: Current Every Day Smoker     Packs/day: 1.00     Types: Cigarettes   ? Smokeless tobacco: Never Used   ? Alcohol use Yes      Comment: rarely   ? Drug use: No   ? Sexual activity: Not on file     Other Topics Concern   ? Not on file     Social History Narrative   ? No narrative on file       Home Medications:  None           Allergies:  No Known Allergies    Review of Systems:  Review of system are positive for right index finger and hand swelling, spider bite.   Negative for fever, chills, SOB, headache, bowel or bladder disturbance.   Pertinent positives and negatives other than those mentioned above are reviewed and negative      Objective     Vital Signs:   Vitals:    12/06/16 0821 12/06/16 0824 12/06/16 1124 12/06/16 1130   BP: 126/69  117/58 121/59   Pulse:  72 105 90   Resp:  21 18 22    Temp:  36.4 ?C (97.5 ?F) 36.3 ?C (97.4 ?F)    TempSrc:  Oral Skin    SpO2:  99% (!) 85% 91%   Weight:       Height:         Physical Exam:

## 2016-12-07 DIAGNOSIS — E7800 Pure hypercholesterolemia, unspecified: Principal | ICD-10-CM

## 2016-12-07 MED ORDER — PHARMACY CONSULT-VANCOMYCIN JX
Status: DC | PRN
Start: 2016-12-07 — End: 2016-12-08

## 2016-12-07 MED ORDER — MORPHINE SULFATE 5 MG/ML IJ SOLN
4 mg | Freq: Four times a day (QID) | INTRAVENOUS | Status: DC | PRN
Start: 2016-12-07 — End: 2016-12-10

## 2016-12-07 MED ORDER — VANCOMYCIN HCL IN NACL 1.5-0.9 GM/250 ML-% IV SOLN
15 mg/kg | Freq: Three times a day (TID) | INTRAVENOUS | Status: DC
Start: 2016-12-07 — End: 2016-12-08

## 2016-12-07 NOTE — Plan of Care
Problem: Pain, Acute & Chronic  Goal: Pain is relieved/acceptable level with minimal side effects  Outcome: Ongoing  Pt complained of pain in his right hand. Pt provided with PRN pain meds. During reassessment pt noted to be in bed with eyes closed resting. Respirations even and unlabored. Will continue to monitor.

## 2016-12-07 NOTE — Plan of Care
Problem: Discharge Planning  Goal: Safe effective discharge  Outcome: Met/Completed Date Met: 12/07/16    Intervention: Assess discharge needs  DCP home with wife and PCP follow up.

## 2016-12-07 NOTE — Consults
?   Peri-wound Tissue: Edematous and red. Wound edges macerated. Suture proximal end.  ? Drainage: Moderate serous and some pus expressed from wound.  ? Odor: None  ? Applied Dressings: Iodoform packing, dry gauze and ACE.    Treatment on Evaluation:    ? TREATMENT TIME: 15 min  ? WHIRLPOOL: Additive: HydroChlor / Time: 10 minutes / Debridement: Tweezer debridement of loose slough and skin / Dressings: Iodoform packing and dry gauze.     Post Treatment:   Patient Position/Safety: Ambulated back to room    Assessment: Patient wound needing further debridement. Some pus expressed from wound after WP. R hand and wrist swollen. Limited ROM.    Problem list:  ? decreased R hand range of motion  ? decreased R hand strength  ? open wound R hand    Other Recommended Services: NA    Discharge Recommendations:   ? Discharge Disposition: Home with intermittent caregiver supervision, Outpatient  OT    ? DME Recommendations:  1. None    **Note: Discharge recommendations may change based on patient progress. Please refer to the most updated progress note for current discharge recommendations.     Plan of Care: Patient will be seen 3-5 times per week for whirlpool.    PT Evaluation Complexity   History Examination Clinical Presentation Decision Making   moderate (1-2 personal factors/comorbidities) low (addressing 1-2 elements) low (stable) low (using standardized patient assessment instrument and/or measureable assessment of functional outcome)   PT Evaluation Complexity Charge: low     G-Codes:  NA  _________________________   Raymond A Go, PT   12/07/2016

## 2016-12-07 NOTE — Progress Notes
Department of Orthopedics   Progress Note    Admit Date: 12/05/2016    LOS: 1 day     Assessment and Plan:     26 y.o. male  with [name].    -Pain control: per primary  -Diet: per primary  -Abx: per primary  -Wound Cx: NGTD  -Wound Smear: GPC  -NWB R Hand  -Elevate R Hand  -PT/OT to eval and treat  -DVT ppx: per primary  -IS 10x/hr, nursing to demonstrate proper technique   -POD1 dressing changed by ortho MD      Subjective:     NAEON. Tolerating PO. Voiding. No BM. Compared to pre-op, states that his swelling is getting worse, pain and ROM about the same.      Patient denies N/V, F/C, chest pain, pressure, SOB and night sweats.     Objective:     Vital Signs: Last Filed Vitals Signs: 24 Hour Range   BP: 122/78 (08/14 0610) BP: (116-128)/(62-82)    Temp: 36.8 ?C (98.2 ?F) (08/14 0610) Temp:  [36.7 ?C (98 ?F)-37.1 ?C (98.7 ?F)]    Pulse: 87 (08/14 0610) Pulse:  [87-117]    Resp: 20 (08/14 0610) Resp:  [15-20]    SpO2: 100 % (08/14 0610) SpO2:  [98 %-100 %]            Date 12/06/16 0700 - 12/07/16 0659 12/07/16 0700 - 12/08/16 0659   Shift 0700-1459 1500-2259 2300-0659 24 Hour Total 0700-1459 1500-2259 2300-0659 24 Hour Total   I  N  T  A  K  E   P.O.  150  150          P.O.  150  150        I.V. 300   300          Volume (mL) (LR infusion) 300   300        IV Piggyback  0  0          Volume (mL) (vancomycin in 0.9% NaCl (VANCOCIN) IVPB 2,000 mg)  0  0        Shift Total 300 150  450       O  U  T  P  U  T   Urine              Urine Occurrence   1 x 1 x        Shift Total           NET 300 150  450         Results for orders placed or performed during the hospital encounter of 12/05/16   Culture, Wound   Result Value Ref Range    Direct Smear Suggests Moderate neutrophils     Direct Smear Suggests No squamous epithelial cells     Direct Smear Suggests       Gram-positive cocci in clusters suggestive of Staphylococcus   Culture, Wound   Result Value Ref Range

## 2016-12-07 NOTE — Consults
?   Peri-wound Tissue: Edematous and red. Wound edges macerated. Suture proximal end.  ? Drainage: Moderate serous and some pus expressed from wound.  ? Odor: None  ? Applied Dressings: Iodoform packing, dry gauze and ACE.    Treatment on Evaluation:    ? TREATMENT TIME: 15 min  ? WHIRLPOOL: Additive: HydroChlor / Time: 10 minutes / Debridement: Tweezer debridement of loose slough and skin / Dressings: Iodoform packing and dry gauze.     Post Treatment:   Patient Position/Safety: Ambulated back to room    Assessment: Patient wound needing further debridement. Some pus expressed from wound after WP. R hand and wrist swollen. Limited ROM.    Problem list:  ? decreased R hand range of motion  ? decreased R hand strength  ? open wound R hand    Other Recommended Services: NA    Discharge Recommendations:   ? Discharge Disposition: Home with intermittent caregiver supervision, Outpatient  OT    ? DME Recommendations:  1. None    **Note: Discharge recommendations may change based on patient progress. Please refer to the most updated progress note for current discharge recommendations.     Plan of Care: Patient will be seen 3-5 times per week for whirlpool.    PT Evaluation Complexity   History Examination Clinical Presentation Decision Making   moderate (1-2 personal factors/comorbidities) low (addressing 1-2 elements) low (stable) low (using standardized patient assessment instrument and/or measureable assessment of functional outcome)   PT Evaluation Complexity Charge: low     G-Codes:  NA  _________________________   Jeanmarie Hubertaymond A Go, PT   12/07/2016

## 2016-12-07 NOTE — Consults
?   Within Functional Limits (WFL) for AROM/PROM and strength    Left Lower Extremity:  ? Within Functional Limits (WFL) for AROM/PROM and strength    Neurological Examination:  ? Sensation: Light Touch: Impaired R hand glove pattern   ? Proprioception: Not Tested  ? Coordination: Not Tested  ? Motor Control: Within Functional Limits  ? Tone: WFL  ? Reflexes: Not Tested    Balance:   ? Sitting: Static  Independent (I)  ? Standing: Static  Independent (I)    Functional Activities:  Bed Mobility:  ? Supine to Sit: Independent (I) with head of bed (HOB) flat  ? Sit to Supine: Independent (I) with head of bed (HOB) flat  Transfers:  ? Sit to/from Stand: Independent (I) with No assistive device  Locomotion:  ? GAIT: Patient ambulated 200 feet with No assistive device, gait belt, Independent (I). Gait Quality: WNL    Outcome Measures:  [place] [facility] AM-PAC Basic Mobility Inpatient Short Form   (Without Stair Climbing):  HOW MUCH DIFFICULTY DOES THE PATIENT CURRENTLY HAVE? SCORE   1. Turning over in bed (including adjusting bedclothes, sheets and blankets)?  2. Moving from lying on back to sitting on the side of the bed?  3. Sitting down on and standing up from a chair with arms? 4  4  4   HOW MUCH HELP FROM ANOTHER PERSON DOES THE PATIENT CURRENTLY NEED?    4. Moving to and from a bed to a chair (including a wheelchair)?  5. Need help to walk in hospital room? 4  4   Scoring: 1 = total (dependent)/unable; 2 = a lot (max/mod assist); 3 = a little (min assist/CGA/SBA/SUP); 4 = none (independent or modified independent).    AM-PAC BASIC MOBILITY SCALE SHORT FORM (without stairs): Raw Score: 20. AM-PAC Score: 60.57. CMS Score: 0.00% - CH    Wound Evaulation:  ? Location: R index finger dorsal surface, proximal to PIP  ? Photograph: Yes. See images in Media Tab. Reference: R index finger 12/07/16  ? Length: 1.5cm, Width: 0.4cm, Depth: 0.5cm  ? Undermining: No  ? Tunneling: No  ? Wound Base: Granulating: 50%, Necrotic: 50%

## 2016-12-07 NOTE — Progress Notes
Sedimentation Rate    Collection Time: 12/05/16  3:17 PM   Result Value    Sed Rate 13   BMP    Collection Time: 12/05/16  3:17 PM   Result Value    Sodium 139    Potassium 4.0    Chloride 101    CO2 25    Urea Nitrogen 11    Creatinine 0.85    BUN/Creatinine Ratio 12.9    Glucose 83    Calcium 9.1    Osmolality Calc 276.1    Anion Gap 13    EGFR >59   CRP High Sensitive    Collection Time: 12/05/16  3:17 PM   Result Value    CRP, High Sensitivity 31.50 (H)   CBC with Differential panel result    Collection Time: 12/05/16  3:17 PM   Result Value    WBC 11.93 (H)    RBC 4.45 (L)    Hemoglobin 13.8 (L)    Hematocrit 38.3 (L)    MCV 86.1    MCH 31.0    MCHC 36.0    RDW 12.8    Platelet Count 264    MPV 10.6    nRBC % 0.0    Absolute NRBC Count 0.00    Neutrophils % 66.8    Lymphocytes % 21.0 (L)    Monocytes % 8.5 (H)    Eosinophils % 3.2    Immature Granulocytes % 0.3    Neutrophils Absolute 7.98    Lymphocytes Absolute 2.51    Monocytes Absolute 1.01    Eosinophils Absolute 0.38    Basophil Absolute 0.02    Absolute Immature Granulocytes 0.03 (H)    Basophils % 0.2   Type and Screen    Collection Time: 12/06/16  8:38 AM   Result Value    ABO Grouping A    Rh Type Positive    Antibody Screen Negative    Specimen Expiration 2016-12-09 23:59   PT-INR    Collection Time: 12/06/16  8:39 AM   Result Value    Protime 13.4    INR 1.0   PTT    Collection Time: 12/06/16  8:39 AM   Result Value    PTT 35   Culture, Wound    Collection Time: 12/06/16 11:06 AM   Result Value    Direct Smear Suggests Moderate neutrophils    Direct Smear Suggests No squamous epithelial cells    Direct Smear Suggests      Gram-positive cocci in clusters suggestive of Staphylococcus   Culture, Wound    Collection Time: 12/06/16 11:06 AM   Result Value    Direct Smear Suggests Moderate neutrophils    Direct Smear Suggests No squamous epithelial cells    Direct Smear Suggests      Gram-positive cocci in clusters suggestive of Staphylococcus

## 2016-12-07 NOTE — Progress Notes
CBC and Differential    Collection Time: 12/07/16  6:15 AM    Narrative    The following orders were created for panel order CBC and Differential.  Procedure                               Abnormality         Status                     ---------                               -----------         ------                     CBC with Differential pa...[[id number]]  Abnormal            Final result                 Please view results for these tests on the individual orders.   PROTIME-INR    Collection Time: 12/07/16  6:15 AM   Result Value    Protime 13.8    INR 1.1   CBC with Differential panel result    Collection Time: 12/07/16  6:15 AM   Result Value    WBC 9.71    RBC 4.43 (L)    Hemoglobin 13.6 (L)    Hematocrit 38.4 (L)    MCV 86.7    MCH 30.7    MCHC 35.4    RDW 12.8    Platelet Count 254    MPV 10.4    nRBC % 0.0    Absolute NRBC Count 0.00    Neutrophils % 58.0    Lymphocytes % 27.8    Monocytes % 9.4 (H)    Eosinophils % 4.1 (H)    Immature Granulocytes % 0.4    Neutrophils Absolute 5.63    Lymphocytes Absolute 2.70    Monocytes Absolute 0.91    Eosinophils Absolute 0.40    Basophil Absolute 0.03    Absolute Immature Granulocytes 0.04 (H)    Basophils % 0.3   Vancomycin Level (Trough)    Collection Time: 12/07/16  6:19 AM   Result Value    Vancomycin Trough 20.2 (HH)    Narrative    Please obtain Tuesday AM prior to dose; sorry for being at shift change!       Per Radiology: Xr Hand Right 3 Views    Result Date: 12/05/2016  EXAMINATION: XR HAND RIGHT 3 VIEWS CLINICAL INFORMATION: Infection. TECHNIQUE: 3 views of the right hand COMPARISON: None. FINDINGS: Normal anatomic alignment. No acute fracture or subluxation. Joint spaces are preserved. There is soft tissue swelling of the second digit. No radiopaque foreign body. Evidence of old fifth metacarpal and additional old carpal fractures.     No acute fracture. Soft tissue swelling of the second digit. I personally

## 2016-12-07 NOTE — Plan of Care
Lab called saying vancomycin trough 20.2 Paged [facility] and informed him.

## 2016-12-07 NOTE — Progress Notes
Department of Medicine  Hospitalist Service      Chief complaint   Chief Complaint   Patient presents with   ? Cellulitis     Primary care physician -  Merilynn FinlandMurphree, Duaine Duchamp      Subjective:     Patient seen and examined bedside. Not in any  Distress  S/p Post OP eval by ortho.     Review of Systems:  All organs and systems were reviewed and unless mentioned above Review of Systems was negative.        Objective:     VITALS: BP 117/58  - Pulse 73  - Temp 36.6 ?C (97.8 ?F) (Oral)  - Resp 16  - Ht 1.778 m (5\' 10" )  - Wt 107.5 kg (237 lb)  - SpO2 100%  - BMI 34.01 kg/m? , 100%,   Intake/Output Summary (Last 24 hours) at 12/07/16 0740  Last data filed at 12/06/16 2038   Gross per 24 hour   Intake              450 ml   Output                0 ml   Net              450 ml     Gen./Psych: Alert; Oriented to time, place, person, Normal affect; Good Insight;   Eyes/Lymph: PERL; no scleral Jaundice, lymph nodes not palpated  ENT: normal external exam of ears and nose; oral mucosa moist  Resp.: No wheezing, rales, ronchi heard; use of accessory muscles not seen  CV: Regular rhythm; murmurs not heard; carotid bruit not heard; JVD not seen  GI: No abdominal tenderness; no hepatosplenomegaly; bowel sounds present  MSK: normal strength; normal ROM, cyanosis and clubbing not seen    Right hand with Second digit in dressing.       Data Review:   Results for orders placed or performed during the hospital encounter of 12/05/16 (from the past 48 hour(s))   CBC and Differential    Collection Time: 12/05/16  3:17 PM    Narrative    The following orders were created for panel order CBC and Differential.  Procedure                               Abnormality         Status                     ---------                               -----------         ------                     CBC with Differential pa.Marland Kitchen.Marland Kitchen.[[id number]][[id number]]  Abnormal            Final result                 Please view results for these tests on the individual orders.

## 2016-12-07 NOTE — Coding Query
In order to improve the specificity and completeness of the data used to assign diagnosis and procedure codes, we need your assistance.    26 y.o. male who presented complaining of right index finger swelling and pain . It started on Thursday and thinks its a spide bite    Clinical indicators which support this query include:     On 12/06/16, the following procedure was performed:    1.Irrigation and debridement of right dorsal index finger abscess    Per OP Report: ".Marland Kitchen.Marland Kitchen.Once sharp dissection was carried down all the way to the extensor tendon, there was a large amount of purulent material measuring about 3mm to 5mm in volume.  The abscess did not track volarly into the flexor tendon sheath nor proximally or distally.  The wound was then thoroughly irrigated with 6 liters of normal saline.  Following this, there were no signs of purulence.  It was noted that the erythema over the dorsum of the hand had vastly improved leaving just the 15mm area of necrosis of the skin. ...".    PHYSICIAN RESPONSE NEEDED BELOW, (please type "X" on any which apply):    1. What type of debridement was performed?   _____x____ Excisional debridement (surgical removal or cutting away of devitalized tissue, necrosis or slough)   _________ Non-excisional debridement (non-operative brushing, irrigating, scrubbing, or washing of devitalizing tissue, necrosis or slough; minor removal of loose fragments; versajet)  _________ Other, please specify _________    2. Please specify depth of debridement.  _________ Tendon   _________ Muscle, please specify _________  ___x______ Subcutaneous tissue  _________ Skin  _________ Other, please specify _________      Thank you for your clarification.

## 2016-12-07 NOTE — Progress Notes
reviewed the images and the residents findings and agree with the above. Read By - Daniel Siragusa M.D.  Electronically Verified By - Daniel Siragusa M.D.  Released Date Time - 12/05/2016 7:10 PM  Resident - Anushi Patel      Labs and imaging reviewed by me      Assessment and Plan:         # Right Second  Finger abscess -   - S/p D and I 12/06/16 - cultures sent from OR- showing GPC   - Ortho - re eval the wound post OP. Awaiting recs if any further D and I   - Cont IV vancomycin started in ER  - FOllow final cultures.   - NWB RUE, elevate   - Pain control.   ?  # Sepsis - Leucocytosis and intermittent tachycardia- resolved.   - No fever,.   - Follow final wound cultures.   ?  # smoker - counseled.     # DVT ppx     Dispo - Final wound cultures and pending any further surgical intervention     12/07/2016, 7:40 AM  Vinay Gagadam, M.D.  Hospital Medicine  P- [phone]

## 2016-12-07 NOTE — Coding Query
In order to improve the specificity and completeness of the data used to assign diagnosis and procedure codes, we need your assistance.    26 y.o. male who presented complaining of right index finger swelling and pain . It started on Thursday and thinks its a spide bite    Clinical indicators which support this query include:     On 12/06/16, the following procedure was performed:    1.Irrigation and debridement of right dorsal index finger abscess    Per OP Report: ".Marland Kitchen.Marland Kitchen.Once sharp dissection was carried down all the way to the extensor tendon, there was a large amount of purulent material measuring about 3mm to 5mm in volume.  The abscess did not track volarly into the flexor tendon sheath nor proximally or distally.  The wound was then thoroughly irrigated with 6 liters of normal saline.  Following this, there were no signs of purulence.  It was noted that the erythema over the dorsum of the hand had vastly improved leaving just the 15mm area of necrosis of the skin. ...".    PHYSICIAN RESPONSE NEEDED BELOW, (please type "X" on any which apply):    1. What type of debridement was performed?   _________ Excisional debridement (surgical removal or cutting away of devitalized tissue, necrosis or slough)   _________ Non-excisional debridement (non-operative brushing, irrigating, scrubbing, or washing of devitalizing tissue, necrosis or slough; minor removal of loose fragments; versajet)  _________ Other, please specify _________    2. Please specify depth of debridement.  _________ Tendon   _________ Muscle, please specify _________  _________ Subcutaneous tissue  _________ Skin  _________ Other, please specify _________      Thank you for your clarification.

## 2016-12-07 NOTE — Progress Notes
Direct Smear Suggests Moderate neutrophils     Direct Smear Suggests No squamous epithelial cells     Direct Smear Suggests       Gram-positive cocci in clusters suggestive of Staphylococcus         Physical Exam  General: Alert, oriented x 3, no acute distress  Respiratory: No increased work of breathing, symmetrical chest rise   Cardiovascular: Regular rate    Right Upper Extremity:   2+ radial pulse. <2sec cap refill   SILT present in M/U/R/A nerve distributions.    +Motor M/U/R/A/PIN/AIN nerve distributions.   2cm incision to dorsal index finger.   Mild edema present proximally to level of wrist.   Mild erythema present proximally to level of wrist.    Tender to palpation of index finger and dorsally to level of mid forearm.    No deformities or crepitus.   Compartments soft/compressible.         Armondo Sanderson, MD  5:14 AM  12/07/2016

## 2016-12-07 NOTE — Plan of Care
Paged hospitialist again concerning pt pain meds. Pt states that Norco does not work. Will continue to monitor pt.

## 2016-12-07 NOTE — Progress Notes
Pt complained that morphine makes him itch and Norco makes him more awake and is not helping with his pain. Pt aware that Morphine can be given Q6H. Will continue to monitor.

## 2016-12-07 NOTE — Plan of Care
Lab called saying vancomycin trough 20.2 Paged Hospitalist Gagadam and informed him.

## 2016-12-07 NOTE — Progress Notes
Department of Medicine  Hospitalist Service      Chief complaint   Chief Complaint   Patient presents with   ? Cellulitis     Primary care physician -  Murphree, Duaine Duchamp      Subjective:     Patient seen and examined bedside. Not in any  Distress  S/p Post OP eval by ortho.     Review of Systems:  All organs and systems were reviewed and unless mentioned above Review of Systems was negative.        Objective:     VITALS: BP 117/58  - Pulse 73  - Temp 36.6 ?C (97.8 ?F) (Oral)  - Resp 16  - Ht 1.778 m (5' 10")  - Wt 107.5 kg (237 lb)  - SpO2 100%  - BMI 34.01 kg/m? , 100%,   Intake/Output Summary (Last 24 hours) at 12/07/16 0740  Last data filed at 12/06/16 2038   Gross per 24 hour   Intake              450 ml   Output                0 ml   Net              450 ml     Gen./Psych: Alert; Oriented to time, place, person, Normal affect; Good Insight;   Eyes/Lymph: PERL; no scleral Jaundice, lymph nodes not palpated  ENT: normal external exam of ears and nose; oral mucosa moist  Resp.: No wheezing, rales, ronchi heard; use of accessory muscles not seen  CV: Regular rhythm; murmurs not heard; carotid bruit not heard; JVD not seen  GI: No abdominal tenderness; no hepatosplenomegaly; bowel sounds present  MSK: normal strength; normal ROM, cyanosis and clubbing not seen    Right hand with Second digit in dressing.       Data Review:   Results for orders placed or performed during the hospital encounter of 12/05/16 (from the past 48 hour(s))   CBC and Differential    Collection Time: 12/05/16  3:17 PM    Narrative    The following orders were created for panel order CBC and Differential.  Procedure                               Abnormality         Status                     ---------                               -----------         ------                     CBC with Differential pa...[[id number]]  Abnormal            Final result                 Please view results for these tests on the individual orders.

## 2016-12-07 NOTE — Consults
?   Within Functional Limits [facility](WFL) for AROM/PROM and strength    Left Lower Extremity:  ? Within Functional Limits [facility](WFL) for AROM/PROM and strength    Neurological Examination:  ? Sensation: Light Touch: Impaired R hand glove pattern   ? Proprioception: Not Tested  ? Coordination: Not Tested  ? Motor Control: Within Functional Limits  ? Tone: WFL  ? Reflexes: Not Tested    Balance:   ? Sitting: Static  Independent (I)  ? Standing: Static  Independent (I)    Functional Activities:  Bed Mobility:  ? Supine to Sit: Independent (I) with head of bed (HOB) flat  ? Sit to Supine: Independent (I) with head of bed (HOB) flat  Transfers:  ? Sit to/from Stand: Independent (I) with No assistive device  Locomotion:  ? GAIT: Patient ambulated 200 feet with No assistive device, gait belt, Independent (I). Gait Quality: WNL    Outcome Measures:  [facility] AM-PAC Basic Mobility Inpatient Short Form   (Without Stair Climbing):  HOW MUCH DIFFICULTY DOES THE PATIENT CURRENTLY HAVE? SCORE   1. Turning over in bed (including adjusting bedclothes, sheets and blankets)?  2. Moving from lying on back to sitting on the side of the bed?  3. Sitting down on and standing up from a chair with arms? 4  4  4    HOW MUCH HELP FROM ANOTHER PERSON DOES THE PATIENT CURRENTLY NEED?    4. Moving to and from a bed to a chair (including a wheelchair)?  5. Need help to walk in hospital room? 4  4   Scoring: 1 = total (dependent)/unable; 2 = a lot (max/mod assist); 3 = a little (min assist/CGA/SBA/SUP); 4 = none (independent or modified independent).    AM-PAC BASIC MOBILITY SCALE SHORT FORM (without stairs): Raw Score: 20. AM-PAC Score: 60.57. CMS Score: 0.00% - CH    Wound Evaulation:  ? Location: R index finger dorsal surface, proximal to PIP  ? Photograph: Yes. See images in Media Tab. Reference: R index finger 12/07/16  ? Length: 1.5cm, Width: 0.4cm, Depth: 0.5cm  ? Undermining: No  ? Tunneling: No  ? Wound Base: Granulating: 50%, Necrotic: 50%

## 2016-12-07 NOTE — Progress Notes
Department of Orthopedics   Progress Note    Admit Date: 12/05/2016    LOS: 1 day     Assessment and Plan:     26 y.o. male  with [name].    -Pain control: per primary  -Diet: per primary  -Abx: per primary  -Wound Cx: NGTD  -Wound Smear: GPC  -NWB R Hand  -Elevate R Hand  -PT/OT to eval and treat  -DVT ppx: per primary  -IS 10x/hr, nursing to demonstrate proper technique   -POD1 dressing changed by ortho MD      Subjective:     NAEON. Tolerating PO. Voiding. No BM. Compared to pre-op, states that his swelling is getting worse, pain and ROM about the same.      Patient denies N/V, F/C, chest pain, pressure, SOB and night sweats.     Objective:     Vital Signs: Last Filed Vitals Signs: 24 Hour Range   BP: 122/78 (08/14 0610) BP: (116-128)/(62-82)    Temp: 36.8 ?C (98.2 ?F) (08/14 0610) Temp:  [36.7 ?C (98 ?F)-37.1 ?C (98.7 ?F)]    Pulse: 87 (08/14 0610) Pulse:  [87-117]    Resp: 20 (08/14 0610) Resp:  [15-20]    SpO2: 100 % (08/14 0610) SpO2:  [98 %-100 %]            Date 12/06/16 0700 - 12/07/16 0659 12/07/16 0700 - 12/08/16 0659   Shift 0700-1459 1500-2259 2300-0659 24 Hour Total 0700-1459 1500-2259 2300-0659 24 Hour Total   I  N  T  A  K  E   P.O.  150  150          P.O.  150  150        I.V. 300   300          Volume (mL) (LR infusion) 300   300        IV Piggyback  0  0          Volume (mL) (vancomycin in 0.9% NaCl (VANCOCIN) IVPB 2,000 mg)  0  0        Shift Total 300 150  450       O  U  T  P  U  T   Urine              Urine Occurrence   1 x 1 x        Shift Total           NET 300 150  450         Results for orders placed or performed during the hospital encounter of 12/05/16   Culture, Wound   Result Value Ref Range    Direct Smear Suggests Moderate neutrophils     Direct Smear Suggests No squamous epithelial cells     Direct Smear Suggests       Gram-positive cocci in clusters suggestive of Staphylococcus   Culture, Wound   Result Value Ref Range

## 2016-12-07 NOTE — Plan of Care
Problem: Pain, Acute & Chronic  Goal: Pain is relieved/acceptable level with minimal side effects  Outcome: Ongoing  Pt complained of pain in his right hand. Pt provided with PRN pain meds. During reassessment pt noted to be in bed with eyes closed resting. Respirations even and unlabored. Will continue to monitor.

## 2016-12-07 NOTE — Progress Notes
Direct Smear Suggests Moderate neutrophils     Direct Smear Suggests No squamous epithelial cells     Direct Smear Suggests       Gram-positive cocci in clusters suggestive of Staphylococcus         Physical Exam  General: Alert, oriented x 3, no acute distress  Respiratory: No increased work of breathing, symmetrical chest rise   Cardiovascular: Regular rate    Right Upper Extremity:   2+ radial pulse. <2sec cap refill   SILT present in M/U/R/A nerve distributions.    +Motor M/U/R/A/PIN/AIN nerve distributions.   2cm incision to dorsal index finger.   Mild edema present proximally to level of wrist.   Mild erythema present proximally to level of wrist.    Tender to palpation of index finger and dorsally to level of mid forearm.    No deformities or crepitus.   Compartments soft/compressible.         Ashok Cordiaody Sanderson, MD  5:14 AM  12/07/2016

## 2016-12-07 NOTE — Coding Query
In order to improve the specificity and completeness of the data used to assign diagnosis and procedure codes, we need your assistance.    26 y.o. male who presented complaining of right index finger swelling and pain . It started on Thursday and thinks its a spide bite    Clinical indicators which support this query include:     On 12/06/16, the following procedure was performed:    1.Irrigation and debridement of right dorsal index finger abscess    Per OP Report: "...Once sharp dissection was carried down all the way to the extensor tendon, there was a large amount of purulent material measuring about 3mm to 5mm in volume.  The abscess did not track volarly into the flexor tendon sheath nor proximally or distally.  The wound was then thoroughly irrigated with 6 liters of normal saline.  Following this, there were no signs of purulence.  It was noted that the erythema over the dorsum of the hand had vastly improved leaving just the 15mm area of necrosis of the skin. ...".    PHYSICIAN RESPONSE NEEDED BELOW, (please type "X" on any which apply):    1. What type of debridement was performed?   _____x____ Excisional debridement (surgical removal or cutting away of devitalized tissue, necrosis or slough)   _________ Non-excisional debridement (non-operative brushing, irrigating, scrubbing, or washing of devitalizing tissue, necrosis or slough; minor removal of loose fragments; versajet)  _________ Other, please specify _________    2. Please specify depth of debridement.  _________ Tendon   _________ Muscle, please specify _________  ___x______ Subcutaneous tissue  _________ Skin  _________ Other, please specify _________      Thank you for your clarification.

## 2016-12-07 NOTE — Progress Notes
Department of Pharmacy  Adult Pharmacokinetics Consult    The pharmacy has implemented the physician approved protocol for the vancomycin pharmacokinetics consult.    Steve Garza is a 26 y.o. male being treated for [name].      Assessment/Plan     1. Patient is on vancomycin day 2.  2. Estimated Creatinine Clearance: 160.3 mL/min (by C-G formula based on SCr of 0.85 mg/dL).  3. Goal Trough: 15 - 20 mcg/ml.  4. Change Vancomycin 1500 mg Q 8 hours  5. A vancomycin trough has been ordered for 12/08/16 at 0730.  6. We will continue to follow, adjusting dose per vancomycin levels, SCr and BUN.                                                      Objective       Labs     Lab Results   Component Value Date/Time    VANCOTROUGH 20.2 ([facility]) 12/07/2016 06:19 AM     Lab Results   Component Value Date/Time    CREATININE 0.85 12/05/2016 03:17 PM                                   Thank you.    Nadia Hani Iwanyshyn, Pharmacist    Vancomycin and Aminoglycoside Policy and Procedure

## 2016-12-07 NOTE — Progress Notes
CBC and Differential    Collection Time: 12/07/16  6:15 AM    Narrative    The following orders were created for panel order CBC and Differential.  Procedure                               Abnormality         Status                     ---------                               -----------         ------                     CBC with Differential pa.Marland Kitchen.Marland Kitchen.[[id number]][[id number]]  Abnormal            Final result                 Please view results for these tests on the individual orders.   PROTIME-INR    Collection Time: 12/07/16  6:15 AM   Result Value    Protime 13.8    INR 1.1   CBC with Differential panel result    Collection Time: 12/07/16  6:15 AM   Result Value    WBC 9.71    RBC 4.43 (L)    Hemoglobin 13.6 (L)    Hematocrit 38.4 (L)    MCV 86.7    MCH 30.7    MCHC 35.4    RDW 12.8    Platelet Count 254    MPV 10.4    nRBC % 0.0    Absolute NRBC Count 0.00    Neutrophils % 58.0    Lymphocytes % 27.8    Monocytes % 9.4 (H)    Eosinophils % 4.1 (H)    Immature Granulocytes % 0.4    Neutrophils Absolute 5.63    Lymphocytes Absolute 2.70    Monocytes Absolute 0.91    Eosinophils Absolute 0.40    Basophil Absolute 0.03    Absolute Immature Granulocytes 0.04 (H)    Basophils % 0.3   Vancomycin Level (Trough)    Collection Time: 12/07/16  6:19 AM   Result Value    Vancomycin Trough 20.2 (HH)    Narrative    Please obtain Tuesday AM prior to dose; sorry for being at shift change!       Per Radiology: Xr Hand Right 3 Views    Result Date: 12/05/2016  EXAMINATION: XR HAND RIGHT 3 VIEWS CLINICAL INFORMATION: Infection. TECHNIQUE: 3 views of the right hand COMPARISON: None. FINDINGS: Normal anatomic alignment. No acute fracture or subluxation. Joint spaces are preserved. There is soft tissue swelling of the second digit. No radiopaque foreign body. Evidence of old fifth metacarpal and additional old carpal fractures.     No acute fracture. Soft tissue swelling of the second digit. I personally

## 2016-12-07 NOTE — Coding Query
In order to improve the specificity and completeness of the data used to assign diagnosis and procedure codes, we need your assistance.    26 y.o. male who presented complaining of right index finger swelling and pain . It started on Thursday and thinks its a spide bite    Clinical indicators which support this query include:     On 12/06/16, the following procedure was performed:    1.Irrigation and debridement of right dorsal index finger abscess    Per OP Report: "...Once sharp dissection was carried down all the way to the extensor tendon, there was a large amount of purulent material measuring about 3mm to 5mm in volume.  The abscess did not track volarly into the flexor tendon sheath nor proximally or distally.  The wound was then thoroughly irrigated with 6 liters of normal saline.  Following this, there were no signs of purulence.  It was noted that the erythema over the dorsum of the hand had vastly improved leaving just the 15mm area of necrosis of the skin. ...".    PHYSICIAN RESPONSE NEEDED BELOW, (please type "X" on any which apply):    1. What type of debridement was performed?   _________ Excisional debridement (surgical removal or cutting away of devitalized tissue, necrosis or slough)   _________ Non-excisional debridement (non-operative brushing, irrigating, scrubbing, or washing of devitalizing tissue, necrosis or slough; minor removal of loose fragments; versajet)  _________ Other, please specify _________    2. Please specify depth of debridement.  _________ Tendon   _________ Muscle, please specify _________  _________ Subcutaneous tissue  _________ Skin  _________ Other, please specify _________      Thank you for your clarification.

## 2016-12-07 NOTE — Plan of Care
Paged hospitialist again concerning pt pain meds. Pt states that Norco does not work. Will continue to monitor pt.

## 2016-12-07 NOTE — Consults
Physical Therapy Wound Care Evaluation      Start Time (min): 1400  End Time (min): 1435  Total Time (min): 35  Room/Bed: 442/442-02    History of Present Illness: Patient is a 26 y.o. male admitted on 12/05/2016 with with R index finger infection. D&I in OR 6/4.      ICD-10-CM ICD-9-CM   1. Cellulitis of finger of right hand L03.011 681.00   2. Right hand pain M79.641 729.5   3. Right wrist pain M25.531 719.43   4. Cellulitis of hand L03.119 682.4   5. Elevated C-reactive protein (CRP) R79.82 790.95   6. Leukocytosis, unspecified type D72.829 288.60   7. Tobacco use Z72.0 305.1   8. Infection B99.9 136.9       Past Medical History:   Diagnosis Date   ? High cholesterol      Past Surgical History:   Procedure Laterality Date   ? DEBRIDEMENT OF ANY BODY PART Right 12/06/2016    DEBRIDEMENT OF RIGHT INDEX FINGER performed by Sharmaine BaseHelms, [name], MD at [facility]       Precautions:  ? FALL    Extremity Precautions:  ? Right Upper Extremity: Non-Weight Bearing    Orthotic, Protective, & Supportive Devices:  ? None    Living Environment/Function:  ? Lives: with spouse  Home assistance: Intermittent  ? Lives in: house:  one story  ? Stairs to Enter: 0  ? Home DME: does not use equipment  ? Prior Level of Function: was independent with self-care, transfers, ambulation, household tasks and/or recreational activities    Subjective: c/o swelling and numbness R hand. "WP felt good."  ? Pain Pre-Treatment: Yes, 6/10, Location: R hand , Intervention: Medication coordination with nurse   ? Pain Post-Treatment: 6/10    Examination:    Observation: [facility]    Vitals: Vitals stable    Cognition:   ? Alert and Oriented to Person, Place, Time and Situation  ? Command Following: Follows Multi-step commands    Right Upper Extremity:  ? WFL except R hand grip 3/5. ROM: Index to thumb pinch -1.5inches    Left Upper Extremity:  ? Within Functional Limits [facility](WFL) for AROM/PROM and strength    Right Lower Extremity:

## 2016-12-07 NOTE — Consults
Physical Therapy Wound Care Evaluation      Start Time (min): 1400  End Time (min): 1435  Total Time (min): 35  Room/Bed: 442/442-02    History of Present Illness: Patient is a 26 y.o. male admitted on 12/05/2016 with with R index finger infection. D&I in OR 6/4.      ICD-10-CM ICD-9-CM   1. Cellulitis of finger of right hand L03.011 681.00   2. Right hand pain M79.641 729.5   3. Right wrist pain M25.531 719.43   4. Cellulitis of hand L03.119 682.4   5. Elevated C-reactive protein (CRP) R79.82 790.95   6. Leukocytosis, unspecified type D72.829 288.60   7. Tobacco use Z72.0 305.1   8. Infection B99.9 136.9       Past Medical History:   Diagnosis Date   ? High cholesterol      Past Surgical History:   Procedure Laterality Date   ? DEBRIDEMENT OF ANY BODY PART Right 12/06/2016    DEBRIDEMENT OF RIGHT INDEX FINGER performed by Helms, [name], MD at [facility]       Precautions:  ? FALL    Extremity Precautions:  ? Right Upper Extremity: Non-Weight Bearing    Orthotic, Protective, & Supportive Devices:  ? None    Living Environment/Function:  ? Lives: with spouse  Home assistance: Intermittent  ? Lives in: house:  one story  ? Stairs to Enter: 0  ? Home DME: does not use equipment  ? Prior Level of Function: was independent with self-care, transfers, ambulation, household tasks and/or recreational activities    Subjective: c/o swelling and numbness R hand. "WP felt good."  ? Pain Pre-Treatment: Yes, 6/10, Location: R hand , Intervention: Medication coordination with nurse   ? Pain Post-Treatment: 6/10    Examination:    Observation: Pt ambulated to [facility] dept.    Vitals: Vitals stable    Cognition:   ? Alert and Oriented to Person, Place, Time and Situation  ? Command Following: Follows Multi-step commands    Right Upper Extremity:  ? WFL except R hand grip 3/5. ROM: Index to thumb pinch -1.5inches    Left Upper Extremity:  ? Within Functional Limits (WFL) for AROM/PROM and strength    Right Lower Extremity:

## 2016-12-07 NOTE — Progress Notes
Pt complained that morphine makes him itch and Norco makes him more awake and is not helping with his pain. Pt aware that Morphine can be given Q6H. Will continue to monitor.

## 2016-12-07 NOTE — Progress Notes
Department of Pharmacy  Adult Pharmacokinetics Consult    The pharmacy has implemented the physician approved protocol for the vancomycin pharmacokinetics consult.    Steve Garza is a 26 y.o. male being treated for [name].      Assessment/Plan     1. Patient is on vancomycin day 2.  2. Estimated Creatinine Clearance: 160.3 mL/min (by C-G formula based on SCr of 0.85 mg/dL).  3. Goal Trough: 15 - 20 mcg/ml.  4. Change Vancomycin 1500 mg Q 8 hours  5. A vancomycin trough has been ordered for 12/08/16 at 0730.  6. We will continue to follow, adjusting dose per vancomycin levels, SCr and BUN.                                                      Objective       Labs     Lab Results   Component Value Date/Time    VANCOTROUGH 20.2 [facility](HH) 12/07/2016 06:19 AM     Lab Results   Component Value Date/Time    CREATININE 0.85 12/05/2016 03:17 PM                                   Thank you.    Steve Garza, Pharmacist    Vancomycin and Aminoglycoside Policy and Procedure

## 2016-12-07 NOTE — Progress Notes
reviewed the images and the residents findings and agree with the above. Read By Shirlee More- Daniel Siragusa M.D.  Electronically Verified By Shirlee More- Daniel Siragusa M.D.  Released Date Time - 12/05/2016 7:10 PM  Resident - Lora PaulaAnushi Patel      Labs and imaging reviewed by me      Assessment and Plan:         # Right Second  Finger abscess -   - S/p D and I 12/06/16 - cultures sent from OR- showing GPC   - Ortho - re eval the wound post OP. Awaiting recs if any further D and I   - Cont IV vancomycin started in ER  - FOllow final cultures.   - NWB RUE, elevate   - Pain control.   ?  # Sepsis - Leucocytosis and intermittent tachycardia- resolved.   - No fever,.   - Follow final wound cultures.   ?  # smoker - counseled.     # DVT ppx     Dispo - Final wound cultures and pending any further surgical intervention     12/07/2016, 7:40 AM  Ulice BoldVinay Gagadam, M.D.  Hospital Medicine  P- [phone]

## 2016-12-08 MED ORDER — ACETAMINOPHEN 325 MG PO TABS
650 mg | ORAL | Status: DC | PRN
Start: 2016-12-08 — End: 2016-12-10

## 2016-12-08 MED ORDER — SULFAMETHOXAZOLE-TRIMETHOPRIM 800-160 MG PO TABS
2 | ORAL_TABLET | Freq: Two times a day (BID) | ORAL | Status: DC
Start: 2016-12-08 — End: 2016-12-10

## 2016-12-08 MED ORDER — IBUPROFEN 600 MG PO TABS
600 mg | Freq: Four times a day (QID) | ORAL | Status: DC | PRN
Start: 2016-12-08 — End: 2016-12-10

## 2016-12-08 MED ORDER — DOCUSATE SODIUM 100 MG PO CAPS
100 mg | Freq: Two times a day (BID) | ORAL | Status: DC
Start: 2016-12-08 — End: 2016-12-10

## 2016-12-08 MED ORDER — POLYETHYLENE GLYCOL 3350 PO PACK
17 g | Freq: Every day | ORAL | Status: DC
Start: 2016-12-08 — End: 2016-12-10

## 2016-12-08 NOTE — Progress Notes
bedside with pt permission. Introduced self and explained CM role in safe discharge planning. Discharge planning assessment completed. Admitted with R index finger cellulitis. S/p D&I on 6/4. Awaiting final wound cultures. IV Vanco q8hrs. Lucretia RoersMichelle L Snype, RN   12/08/16 1519 S/p D and I on 12/06/16. 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 Antibiotics changed to Bactrim 2 Tabs BID. Rx for 14 days in total. Lucretia RoersMichelle L Snype, RN  Discharge Plan: Home  Discharge Pending: Ortho clearance  Expected Discharge Date: 12/09/16  Transportation/Contact Person at Discharge: Wife  Lucretia RoersMichelle L Snype, RN   12/08/16 [phone]

## 2016-12-08 NOTE — Plan of Care
Paged Hospitalist that Pt has MRSA in wound and vanc trough critical level is 30.6  Will continue to monitor pt.

## 2016-12-08 NOTE — Progress Notes
Department of Medicine  Hospitalist Service      Chief complaint   Chief Complaint   Patient presents with   ? Cellulitis     Primary care physician -  [place], Steve Garza      Subjective:     Patient seen and examined bedside. Not in any  Distress  Sitting in chair, receiving whirlpool therapy   Constipated.     Review of Systems:  All organs and systems were reviewed and unless mentioned above Review of Systems was negative.        Objective:     VITALS: BP 98/54  - Pulse 59  - Temp 36.6 ?C (97.9 ?F) (Oral)  - Resp 16  - Ht 1.778 m (5' 10")  - Wt 107.5 kg (237 lb)  - SpO2 99%  - BMI 34.01 kg/m? , 99%,     Intake/Output Summary (Last 24 hours) at 12/08/16 0719  Last data filed at 12/08/16 0600   Gross per 24 hour   Intake              850 ml   Output                0 ml   Net              850 ml     Gen./Psych: Alert; Oriented to time, place, person, Normal affect; Good Insight;   Eyes/Lymph: PERL; no scleral Jaundice, lymph nodes not palpated  ENT: normal external exam of ears and nose; oral mucosa moist  Resp.: No wheezing, rales, ronchi heard; use of accessory muscles not seen  CV: Regular rhythm; murmurs not heard; carotid bruit not heard; JVD not seen  GI: No abdominal tenderness; no hepatosplenomegaly; bowel sounds present  MSK: normal strength; normal ROM, cyanosis and clubbing not seen    Right hand with Second digit in dressing.       Data Review:   Results for orders placed or performed during the hospital encounter of 12/05/16 (from the past 48 hour(s))   Type and Screen    Collection Time: 12/06/16  8:38 AM   Result Value    ABO Grouping A    Rh Type Positive    Antibody Screen Negative    Specimen Expiration 2016-12-09 23:59   PT-INR    Collection Time: 12/06/16  8:39 AM   Result Value    Protime 13.4    INR 1.0   PTT    Collection Time: 12/06/16  8:39 AM   Result Value    PTT 35   Culture, Wound    Collection Time: 12/06/16 11:06 AM   Result Value    Culture Result Staphylococcus aureus

## 2016-12-08 NOTE — Progress Notes
Department of Orthopedics   Progress Note    Admit Date: 12/05/2016    LOS: 2 days     Assessment and Plan:     26 y.o. male  with [name].    -Pain control: per primary  -Diet: per primary  -Abx: per primary  -Wound Cx: Staph Auereus  -Wound Smear: GPC  -NWB R Hand  -Elevate R Hand  -PT/OT to eval and treat: whirlpool therapy  -DVT ppx: per primary  -IS 10x/hr, nursing to demonstrate proper technique   -daily dressing changed by ortho MD today      Subjective:     NAEON. Tolerating PO. Voiding. States his pain and swelling are much improved after whirlpool therapy.     Patient denies N/V, F/C, chest pain, pressure, SOB and night sweats.     Objective:     Vital Signs: Last Filed Vitals Signs: 24 Hour Range   BP: 122/78 (08/14 0610) BP: (116-128)/(62-82)    Temp: 36.8 ?C (98.2 ?F) (08/14 0610) Temp:  [36.7 ?C (98 ?F)-37.1 ?C (98.7 ?F)]    Pulse: 87 (08/14 0610) Pulse:  [87-117]    Resp: 20 (08/14 0610) Resp:  [15-20]    SpO2: 100 % (08/14 0610) SpO2:  [98 %-100 %]            Date 12/07/16 0700 - 12/08/16 0659 12/08/16 0700 - 12/09/16 0659   Shift 0700-1459 1500-2259 2300-0659 24 Hour Total 0700-1459 1500-2259 2300-0659 24 Hour Total   I  N  T  A  K  E   P.O. 200 150  350          P.O. 200 150  350        IV Piggyback  0  0          Volume (mL) (vancomycin in 0.9% NaCl (VANCOCIN) IVPB 1,500 mg)  0  0        Shift Total 200 150  350       O  U  T  P  U  T   Urine              Urine Occurrence  1 x  1 x        Shift Total           NET 200 150  350         Results for orders placed or performed during the hospital encounter of 12/05/16   Culture, Wound   Result Value Ref Range    Culture Result Staphylococcus aureus     Direct Smear Suggests Moderate neutrophils     Direct Smear Suggests No squamous epithelial cells     Direct Smear Suggests       Gram-positive cocci in clusters suggestive of Staphylococcus   Culture, Wound   Result Value Ref Range    Culture Result Staphylococcus aureus

## 2016-12-08 NOTE — Plan of Care
Problem: Outcome Measure Goals  Goal: Increase AMPAC score  Patient will demonstrate improvement in balance and functional mobility as indicated by an increase in AMPAC score to 20/20 in order to decrease risk for falls.    Outcome: Met/Completed Date Met: 12/08/16      Problem: Wound  Goal: Patient will demonstrate decreased wound size  Patient will demonstrate decreased wound size on R index finger by 50% to promote wound healing.      Outcome: Ongoing

## 2016-12-08 NOTE — Progress Notes
bedside with pt permission. Introduced self and explained CM role in safe discharge planning. Discharge planning assessment completed. Admitted with R index finger cellulitis. S/p D&I on 6/4. Awaiting final wound cultures. IV Vanco q8hrs  Discharge Plan: Home  Discharge Pending: Wound cultures  Expected Discharge Date: Expected discharge date: 12/08/16  Transportation/Contact Person at Discharge: Wife  Michelle L Snype, RN   12/07/2016 3:35 PM

## 2016-12-08 NOTE — Progress Notes
Direct Smear Suggests Moderate neutrophils     Direct Smear Suggests No squamous epithelial cells     Direct Smear Suggests       Gram-positive cocci in clusters suggestive of Staphylococcus   Fungus Culture   Result Value Ref Range    Culture Result No fungus isolated     Fungus Stain No fungal elements seen    Fungus Culture   Result Value Ref Range    Culture Result No fungus isolated     Fungus Stain No fungal elements seen          Physical Exam  General: Alert, oriented x 3, no acute distress  Respiratory: No increased work of breathing, symmetrical chest rise   Cardiovascular: Regular rate    Right Upper Extremity:   2+ radial pulse. <2sec cap refill   SILT present in M/U/R/A nerve distributions.    +Motor M/U/R/A/PIN/AIN nerve distributions.   2cm incision to dorsal index finger.   Mild edema present proximally to level of wrist.   Mild erythema present proximally to level of wrist.    Tender to palpation of index finger and dorsally to level of mid forearm.    No deformities or crepitus.   Compartments soft/compressible.         Zyquan Sanderson, MD  5:50 AM  12/08/2016

## 2016-12-08 NOTE — Progress Notes
Department of Case Management  Discharge Planning     Patient ID:   NAME:  Steve Garza  MRN: [id number]    AGE: 26 y.o.   DOB: [birth year]  Date of Admission: 12/05/2016      Address: [place] [address]     Emergency Contact: Wife, Steve Garza  Payor: Payor: PENDING MEDICAID MEDICALLY NEEDY / Plan: PENDING MEDICAID MEDICALLY NEEDY / Product Type: Pending /     Medicare Important Message Provided:    Patient admitted with   Patient Active Problem List   Diagnosis   ? Infection       Patient has Capacity for Decision-Making: Yes  Prior Living Arrangements / Resides with: Lives with wife and minor children  Patient Capacity for Self-Care / Level of Independence: Self care and independent  Caregiver / Support System: Wife  Patient provided contact person:    Name:  Steve Garza  Relationship:  Wife  Phone #:  [phone]  Advance Directive: None on file. Encouraged to complete. Verbally designated wife to be HCS.   Health Care Proxy: No  Therapy Recommendations: Home with intermittent caregiver supervision, Outpatient  OT  [place], Durable Medical Equipment: No HHC   Community Resources:   Outpatient Case Manager:  Primary Care Physician: [place], [place]: No Pharmacies Listed  Social History:   Social History     Social History   ? Marital status: Married     Spouse name: N/A   ? Number of children: N/A   ? Years of education: N/A     Occupational History   ? Not on file.     Social History Main Topics   ? Smoking status: Current Every Day Smoker     Packs/day: 1.00     Types: Cigarettes   ? Smokeless tobacco: Never Used   ? Alcohol use Yes      Comment: rarely   ? Drug use: No   ? Sexual activity: Not on file     Other Topics Concern   ? Not on file     Social History Narrative   ? No narrative on file     Assessment and Discharge Plan:  Additional Information: Met with patient and wife and family at the

## 2016-12-08 NOTE — Progress Notes
Read By Shirlee More- Daniel Siragusa M.D.  Electronically Verified By Shirlee More- Daniel Siragusa M.D.  Released Date Time - 12/05/2016 7:10 PM  Resident - Lora PaulaAnushi Patel      Labs and imaging reviewed by me      Assessment and Plan:         # Right Second  Finger abscess -   - S/p D and I 12/06/16 - cultures sent from OR- showing staphylococcus    - Cont IV vancomycin    - FOllow final cultures.   - NWB RUE, elevate , whirlpool therapy   - Pain control. Add motrin.   ?  # Sepsis - Leucocytosis and intermittent tachycardia- resolved.   - No fever,.   - Follow final wound cultures.     # Constipation - likely narcotic induced. Bowel regimen.   ?  # smoker - counseled.     # DVT ppx     Dispo - Final wound cultures and pending any further surgical intervention . D/c planning once ortho clears and cultures finalized.     12/08/2016, 7:19 AM  Ulice BoldVinay Gagadam, M.D.  Hospital Medicine  [phone]

## 2016-12-08 NOTE — Progress Notes
MPV 10.4    nRBC % 0.0    Absolute NRBC Count 0.00    Neutrophils % 58.0    Lymphocytes % 27.8    Monocytes % 9.4 (H)    Eosinophils % 4.1 (H)    Immature Granulocytes % 0.4    Neutrophils Absolute 5.63    Lymphocytes Absolute 2.70    Monocytes Absolute 0.91    Eosinophils Absolute 0.40    Basophil Absolute 0.03    Absolute Immature Granulocytes 0.04 (H)    Basophils % 0.3   Vancomycin Level (Trough)    Collection Time: 12/07/16  6:19 AM   Result Value    Vancomycin Trough 20.2 (HH)    Narrative    Please obtain Tuesday AM prior to dose; sorry for being at shift change!   Comprehensive Metabolic Panel    Collection Time: 12/07/16  6:08 PM   Result Value    Sodium 143    Potassium 4.1    Chloride 104    CO2 26    Urea Nitrogen 6    Creatinine 0.92    BUN/Creatinine Ratio 6.5    Glucose 135 (H)    Calcium 9.0    Total Protein 6.5    Albumin 4.1    Calc Total Globuin 2.4    ALBUMIN/GLOBULIN RATIO 1.7    Total Bilirubin 0.4    Alkaline Phosphatase 80    AST 13 (L)    ALT 19    Osmolality Calc 284.6    Anion Gap 13    EGFR >59   Magnesium    Collection Time: 12/07/16  6:08 PM   Result Value    Magnesium 2.1   Phosphorus    Collection Time: 12/07/16  6:08 PM   Result Value    Phosphorus,Inorganic 3.0   Prealbumin    Collection Time: 12/07/16  6:08 PM   Result Value    Prealbumin 19.0 (L)       Per Radiology: Xr Hand Right 3 Views    Result Date: 12/05/2016  EXAMINATION: XR HAND RIGHT 3 VIEWS CLINICAL INFORMATION: Infection. TECHNIQUE: 3 views of the right hand COMPARISON: None. FINDINGS: Normal anatomic alignment. No acute fracture or subluxation. Joint spaces are preserved. There is soft tissue swelling of the second digit. No radiopaque foreign body. Evidence of old fifth metacarpal and additional old carpal fractures.     No acute fracture. Soft tissue swelling of the second digit. I personally reviewed the images and the residents findings and agree with the above.

## 2016-12-08 NOTE — Progress Notes
Department of Orthopedics   Progress Note    Admit Date: 12/05/2016    LOS: 2 days     Assessment and Plan:     26 y.o. male  with [name].    -Pain control: per primary  -Diet: per primary  -Abx: per primary  -Wound Cx: Staph Auereus  -Wound Smear: GPC  -NWB R Hand  -Elevate R Hand  -PT/OT to eval and treat: whirlpool therapy  -DVT ppx: per primary  -IS 10x/hr, nursing to demonstrate proper technique   -daily dressing changed by ortho MD today      Subjective:     NAEON. Tolerating PO. Voiding. States his pain and swelling are much improved after whirlpool therapy.     Patient denies N/V, F/C, chest pain, pressure, SOB and night sweats.     Objective:     Vital Signs: Last Filed Vitals Signs: 24 Hour Range   BP: 122/78 (08/14 0610) BP: (116-128)/(62-82)    Temp: 36.8 ?C (98.2 ?F) (08/14 0610) Temp:  [36.7 ?C (98 ?F)-37.1 ?C (98.7 ?F)]    Pulse: 87 (08/14 0610) Pulse:  [87-117]    Resp: 20 (08/14 0610) Resp:  [15-20]    SpO2: 100 % (08/14 0610) SpO2:  [98 %-100 %]            Date 12/07/16 0700 - 12/08/16 0659 12/08/16 0700 - 12/09/16 0659   Shift 0700-1459 1500-2259 2300-0659 24 Hour Total 0700-1459 1500-2259 2300-0659 24 Hour Total   I  N  T  A  K  E   P.O. 200 150  350          P.O. 200 150  350        IV Piggyback  0  0          Volume (mL) (vancomycin in 0.9% NaCl (VANCOCIN) IVPB 1,500 mg)  0  0        Shift Total 200 150  350       O  U  T  P  U  T   Urine              Urine Occurrence  1 x  1 x        Shift Total           NET 200 150  350         Results for orders placed or performed during the hospital encounter of 12/05/16   Culture, Wound   Result Value Ref Range    Culture Result Staphylococcus aureus     Direct Smear Suggests Moderate neutrophils     Direct Smear Suggests No squamous epithelial cells     Direct Smear Suggests       Gram-positive cocci in clusters suggestive of Staphylococcus   Culture, Wound   Result Value Ref Range    Culture Result Staphylococcus aureus

## 2016-12-08 NOTE — Progress Notes
Department of Medicine  Hospitalist Service      Chief complaint   Chief Complaint   Patient presents with   ? Cellulitis     Primary care physician -  Merilynn FinlandMurphree, Duaine Duchamp      Subjective:     Patient seen and examined bedside. Not in any  Distress  Sitting in chair, receiving whirlpool therapy   Constipated.     Review of Systems:  All organs and systems were reviewed and unless mentioned above Review of Systems was negative.        Objective:     VITALS: BP 98/54  - Pulse 59  - Temp 36.6 ?C (97.9 ?F) (Oral)  - Resp 16  - Ht 1.778 m (5\' 10" )  - Wt 107.5 kg (237 lb)  - SpO2 99%  - BMI 34.01 kg/m? , 99%,     Intake/Output Summary (Last 24 hours) at 12/08/16 0719  Last data filed at 12/08/16 0600   Gross per 24 hour   Intake              850 ml   Output                0 ml   Net              850 ml     Gen./Psych: Alert; Oriented to time, place, person, Normal affect; Good Insight;   Eyes/Lymph: PERL; no scleral Jaundice, lymph nodes not palpated  ENT: normal external exam of ears and nose; oral mucosa moist  Resp.: No wheezing, rales, ronchi heard; use of accessory muscles not seen  CV: Regular rhythm; murmurs not heard; carotid bruit not heard; JVD not seen  GI: No abdominal tenderness; no hepatosplenomegaly; bowel sounds present  MSK: normal strength; normal ROM, cyanosis and clubbing not seen    Right hand with Second digit in dressing.       Data Review:   Results for orders placed or performed during the hospital encounter of 12/05/16 (from the past 48 hour(s))   Type and Screen    Collection Time: 12/06/16  8:38 AM   Result Value    ABO Grouping A    Rh Type Positive    Antibody Screen Negative    Specimen Expiration 2016-12-09 23:59   PT-INR    Collection Time: 12/06/16  8:39 AM   Result Value    Protime 13.4    INR 1.0   PTT    Collection Time: 12/06/16  8:39 AM   Result Value    PTT 35   Culture, Wound    Collection Time: 12/06/16 11:06 AM   Result Value    Culture Result Staphylococcus aureus

## 2016-12-08 NOTE — Progress Notes
Direct Smear Suggests Moderate neutrophils    Direct Smear Suggests No squamous epithelial cells    Direct Smear Suggests      Gram-positive cocci in clusters suggestive of Staphylococcus   Culture, Wound    Collection Time: 12/06/16 11:06 AM   Result Value    Culture Result Staphylococcus aureus    Direct Smear Suggests Moderate neutrophils    Direct Smear Suggests No squamous epithelial cells    Direct Smear Suggests      Gram-positive cocci in clusters suggestive of Staphylococcus   Fungus Culture    Collection Time: 12/06/16 11:06 AM   Result Value    Culture Result No fungus isolated    Fungus Stain No fungal elements seen    Narrative    Specimen received on swab.? Swabs are suboptimal for the recovery of fungi from this site.? The optimum specimen for the recovery of fungi is tissue or fluid.     Fungus Culture    Collection Time: 12/06/16 11:06 AM   Result Value    Culture Result No fungus isolated    Fungus Stain No fungal elements seen    Narrative    Specimen received on swab.? Swabs are suboptimal for the recovery of fungi from this site.? The optimum specimen for the recovery of fungi is tissue or fluid.   CBC and Differential    Collection Time: 12/07/16  6:15 AM    Narrative    The following orders were created for panel order CBC and Differential.  Procedure                               Abnormality         Status                     ---------                               -----------         ------                     CBC with Differential pa.Marland Kitchen.Marland Kitchen.[[id number]][[id number]]  Abnormal            Final result                 Please view results for these tests on the individual orders.   PROTIME-INR    Collection Time: 12/07/16  6:15 AM   Result Value    Protime 13.8    INR 1.1   CBC with Differential panel result    Collection Time: 12/07/16  6:15 AM   Result Value    WBC 9.71    RBC 4.43 (L)    Hemoglobin 13.6 (L)    Hematocrit 38.4 (L)    MCV 86.7    MCH 30.7    MCHC 35.4    RDW 12.8    Platelet Count 254

## 2016-12-08 NOTE — Progress Notes
?   Discharge Disposition: Home with intermittent caregiver supervision    ? DME Recommendations:  1. None    **Note: Discharge recommendations may change based on patient progress. Please refer to the most updated progress note for current discharge recommendations.     Post Treatment:   Patient Position/Safety: Returned to room form WP    Frequency: Patient will be seen 3-5 times per week.    _________________________  Raymond A Go, PT  12/08/2016

## 2016-12-08 NOTE — Consults
Occupational Therapy Screen  Pt is a 26 y.o. yo male admitted on 12/05/2016 for R index finger infection, s/p D&I.  .  Pt seen for OT screen. Pt is currently at baseline level with ADLs  .Physical therapy is following for whirlpool treatments.  . No skilled OT services indicated at this time.    Stacy Stockard, OT   12/08/2016

## 2016-12-08 NOTE — Progress Notes
bedside with pt permission. Introduced self and explained CM role in safe discharge planning. Discharge planning assessment completed. Admitted with R index finger cellulitis. S/p D&I on 6/4. Awaiting final wound cultures. IV Vanco q8hrs. Michelle L Snype, RN   12/08/16 1519 S/p D and I on 12/06/16. 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 Antibiotics changed to Bactrim 2 Tabs BID. Rx for 14 days in total. Michelle L Snype, RN  Discharge Plan: Home  Discharge Pending: Ortho clearance  Expected Discharge Date: 12/09/16  Transportation/Contact Person at Discharge: Wife  Michelle L Snype, RN   12/08/16 1519

## 2016-12-08 NOTE — Progress Notes
Direct Smear Suggests Moderate neutrophils    Direct Smear Suggests No squamous epithelial cells    Direct Smear Suggests      Gram-positive cocci in clusters suggestive of Staphylococcus   Culture, Wound    Collection Time: 12/06/16 11:06 AM   Result Value    Culture Result Staphylococcus aureus    Direct Smear Suggests Moderate neutrophils    Direct Smear Suggests No squamous epithelial cells    Direct Smear Suggests      Gram-positive cocci in clusters suggestive of Staphylococcus   Fungus Culture    Collection Time: 12/06/16 11:06 AM   Result Value    Culture Result No fungus isolated    Fungus Stain No fungal elements seen    Narrative    Specimen received on swab.? Swabs are suboptimal for the recovery of fungi from this site.? The optimum specimen for the recovery of fungi is tissue or fluid.     Fungus Culture    Collection Time: 12/06/16 11:06 AM   Result Value    Culture Result No fungus isolated    Fungus Stain No fungal elements seen    Narrative    Specimen received on swab.? Swabs are suboptimal for the recovery of fungi from this site.? The optimum specimen for the recovery of fungi is tissue or fluid.   CBC and Differential    Collection Time: 12/07/16  6:15 AM    Narrative    The following orders were created for panel order CBC and Differential.  Procedure                               Abnormality         Status                     ---------                               -----------         ------                     CBC with Differential pa...[[id number]]  Abnormal            Final result                 Please view results for these tests on the individual orders.   PROTIME-INR    Collection Time: 12/07/16  6:15 AM   Result Value    Protime 13.8    INR 1.1   CBC with Differential panel result    Collection Time: 12/07/16  6:15 AM   Result Value    WBC 9.71    RBC 4.43 (L)    Hemoglobin 13.6 (L)    Hematocrit 38.4 (L)    MCV 86.7    MCH 30.7    MCHC 35.4    RDW 12.8    Platelet Count 254

## 2016-12-08 NOTE — Consults
Occupational Therapy Screen  Pt is a 26 y.o. yo male admitted on 12/05/2016 for R index finger infection, s/p D&I.  Marland Kitchen.  Pt seen for OT screen. Pt is currently at baseline level with ADLs  .Physical therapy is following for whirlpool treatments.  . No skilled OT services indicated at this time.    Huntley EstelleStacy Stockard, OT   12/08/2016

## 2016-12-08 NOTE — Progress Notes
Physical Therapy Wound Care Note       Start Time (min): 1400  End Time (min): 1430  Total Time (min): 30    Room/Bed: 442/442-02  Admit Date: 12/05/2016  Current Medical Condition: No changes     Past Medical History:   Diagnosis Date   ? High cholesterol      Past Surgical History:   Procedure Laterality Date   ? DEBRIDEMENT OF ANY BODY PART Right 12/06/2016    DEBRIDEMENT OF RIGHT INDEX FINGER performed by Sharmaine BaseHelms, [name], MD at [facility]       Precautions:  ? Contact  ? FALL    Extremity Precautions:  ? Right Upper Extremity: Non-Weight Bearing    Orthotic, Protective, & Supportive Devices:  ? None    Subjective: Pt reports swelling decreased. "I'm moving my hand better."  ? Pain Pre-Treatment: Yes, 5/10, Location: R hand , Intervention: Medication coordination with nurse   ? Pain Post-Treatment: 5/10    Cognition:   ? Alert and Oriented to Person, Place, Time and Situation  ? Command Following: Follows Multi-step commands    Objective:     Wound Evaulation:  ? Location: R index finger  ? Photograph: Yes. See images in Media Tab. Reference: R index finger 12/08/16  ? Length: 1.5cm, Width: 0.5cm, Depth: 0.5cm  ? Undermining: No  ? Tunneling: No  ? Wound Base: Granulating: 75%, Necrotic: 25%  ? Peri-wound Tissue: Decreased edema. Macerated wound edges. Suture intact proximally.  ? Drainage: Moderate serous  ? Odor: None  ? Applied Dressings: Iodoform packing. Dry gauze and ACE.    Treatment: Whirlpool with HydroChlor additive x2415min. Debridement of loose skin and slough.    Therapeutic Exercises: [facility]     Education: PT areas patient educated on: precautions    Outcome Measures:  NOT TESTED    Assessment: Patient hand with decreased swelling. Improving granulation of wound bed. Able to pinch with R index finger and thumb.    Problem list:  ? decreased R hand range of motion  ? decreased R hand strength  ? open wound R hand    Other Recommended Services: NA    Discharge Recommendations:

## 2016-12-08 NOTE — Progress Notes
Physical Therapy Wound Care Note       Start Time (min): 1400  End Time (min): 1430  Total Time (min): 30    Room/Bed: 442/442-02  Admit Date: 12/05/2016  Current Medical Condition: No changes     Past Medical History:   Diagnosis Date   ? High cholesterol      Past Surgical History:   Procedure Laterality Date   ? DEBRIDEMENT OF ANY BODY PART Right 12/06/2016    DEBRIDEMENT OF RIGHT INDEX FINGER performed by Helms, [name], MD at [facility]       Precautions:  ? Contact  ? FALL    Extremity Precautions:  ? Right Upper Extremity: Non-Weight Bearing    Orthotic, Protective, & Supportive Devices:  ? None    Subjective: Pt reports swelling decreased. "I'm moving my hand better."  ? Pain Pre-Treatment: Yes, 5/10, Location: R hand , Intervention: Medication coordination with nurse   ? Pain Post-Treatment: 5/10    Cognition:   ? Alert and Oriented to Person, Place, Time and Situation  ? Command Following: Follows Multi-step commands    Objective:     Wound Evaulation:  ? Location: R index finger  ? Photograph: Yes. See images in Media Tab. Reference: R index finger 12/08/16  ? Length: 1.5cm, Width: 0.5cm, Depth: 0.5cm  ? Undermining: No  ? Tunneling: No  ? Wound Base: Granulating: 75%, Necrotic: 25%  ? Peri-wound Tissue: Decreased edema. Macerated wound edges. Suture intact proximally.  ? Drainage: Moderate serous  ? Odor: None  ? Applied Dressings: Iodoform packing. Dry gauze and ACE.    Treatment: Whirlpool with HydroChlor additive x15min. Debridement of loose skin and slough.    Therapeutic Exercises: AROM R fingers in WP     Education: PT areas patient educated on: precautions    Outcome Measures:  NOT TESTED    Assessment: Patient hand with decreased swelling. Improving granulation of wound bed. Able to pinch with R index finger and thumb.    Problem list:  ? decreased R hand range of motion  ? decreased R hand strength  ? open wound R hand    Other Recommended Services: NA    Discharge Recommendations:

## 2016-12-08 NOTE — Plan of Care
Problem: Pain, Acute & Chronic  Goal: Pain is relieved/acceptable level with minimal side effects  Outcome: Ongoing  Pt requested PRN Morphine for pain to the right hand. Pt pain level 7/10. Pt noted to be in bed with eyes closed during reassessment. Will continue to monitor.

## 2016-12-08 NOTE — Plan of Care
Problem: Pain, Acute & Chronic  Goal: Pain is relieved/acceptable level with minimal side effects  Outcome: Ongoing  Pt requested PRN Morphine for pain to the right hand. Pt pain level 7/10. Pt noted to be in bed with eyes closed during reassessment. Will continue to monitor.

## 2016-12-08 NOTE — Plan of Care
Paged Hospitalist that Pt has MRSA in wound and vanc trough critical level is 30.6  Will continue to monitor pt.

## 2016-12-08 NOTE — Progress Notes
Read By - Daniel Siragusa M.D.  Electronically Verified By - Daniel Siragusa M.D.  Released Date Time - 12/05/2016 7:10 PM  Resident - Anushi Patel      Labs and imaging reviewed by me      Assessment and Plan:         # Right Second  Finger abscess -   - S/p D and I 12/06/16 - cultures sent from OR- showing staphylococcus    - Cont IV vancomycin    - FOllow final cultures.   - NWB RUE, elevate , whirlpool therapy   - Pain control. Add motrin.   ?  # Sepsis - Leucocytosis and intermittent tachycardia- resolved.   - No fever,.   - Follow final wound cultures.     # Constipation - likely narcotic induced. Bowel regimen.   ?  # smoker - counseled.     # DVT ppx     Dispo - Final wound cultures and pending any further surgical intervention . D/c planning once ortho clears and cultures finalized.     12/08/2016, 7:19 AM  Vinay Gagadam, M.D.  Hospital Medicine  P- [phone]

## 2016-12-08 NOTE — Progress Notes
bedside with pt permission. Introduced self and explained CM role in safe discharge planning. Discharge planning assessment completed. Admitted with R index finger cellulitis. S/p D&I on 6/4. Awaiting final wound cultures. IV Vanco q8hrs  Discharge Plan: Home  Discharge Pending: Wound cultures  Expected Discharge Date: Expected discharge date: 12/08/16  Transportation/Contact Person at Discharge: Wife  Lucretia RoersMichelle L Snype, RN   12/07/2016 3:35 PM

## 2016-12-08 NOTE — Progress Notes
Direct Smear Suggests Moderate neutrophils     Direct Smear Suggests No squamous epithelial cells     Direct Smear Suggests       Gram-positive cocci in clusters suggestive of Staphylococcus   Fungus Culture   Result Value Ref Range    Culture Result No fungus isolated     Fungus Stain No fungal elements seen    Fungus Culture   Result Value Ref Range    Culture Result No fungus isolated     Fungus Stain No fungal elements seen          Physical Exam  General: Alert, oriented x 3, no acute distress  Respiratory: No increased work of breathing, symmetrical chest rise   Cardiovascular: Regular rate    Right Upper Extremity:   2+ radial pulse. <2sec cap refill   SILT present in M/U/R/A nerve distributions.    +Motor M/U/R/A/PIN/AIN nerve distributions.   2cm incision to dorsal index finger.   Mild edema present proximally to level of wrist.   Mild erythema present proximally to level of wrist.    Tender to palpation of index finger and dorsally to level of mid forearm.    No deformities or crepitus.   Compartments soft/compressible.         Ashok Cordiaody Sanderson, MD  5:50 AM  12/08/2016

## 2016-12-08 NOTE — Progress Notes
On June 6th at 4:14 pm Care Coordinator spoke with patient at bedside and explained her role about follow up appt. Patient stated he will make his own follow up appt.    Rolly PancakeJanice Davis, Care Coordinator

## 2016-12-08 NOTE — Progress Notes
?   Discharge Disposition: Home with intermittent caregiver supervision    ? DME Recommendations:  1. None    **Note: Discharge recommendations may change based on patient progress. Please refer to the most updated progress note for current discharge recommendations.     Post Treatment:   [facility]    Frequency: Patient will be seen 3-5 times per week.    _________________________  Jeanmarie Hubertaymond A Go, PT  12/08/2016

## 2016-12-08 NOTE — Progress Notes
On June 6th at 4:14 pm Care Coordinator spoke with patient at bedside and explained her role about follow up appt. Patient stated he will make his own follow up appt.    Janice Davis, Care Coordinator

## 2016-12-08 NOTE — Progress Notes
Read By Shirlee More- Daniel Siragusa M.D.  Electronically Verified By Shirlee More- Daniel Siragusa M.D.  Released Date Time - 12/05/2016 7:10 PM  Resident - Lora PaulaAnushi Patel      Labs and imaging reviewed by me      Assessment and Plan:         # Right Second  Finger abscess -   - S/p D and I 12/06/16 - cultures sent from OR- showing MRSA , resistant to cleocin, change Abx to Bactrim 2 Tabs BID - Rx for 14 days in total .   - D/c vancomycin    - NWB RUE, elevate , whirlpool therapy   - Pain control. Add motrin.   ?  # Sepsis - Leucocytosis and intermittent tachycardia- resolved.   - No fever,.     # Constipation - likely narcotic induced. Bowel regimen.   ?  # smoker - counseled.     # DVT ppx     Dispo -   D/c planning once ortho clears  .    12/08/2016, 7:19 AM  Ulice BoldVinay Gagadam, M.D.  Hospital Medicine  P- [phone]

## 2016-12-08 NOTE — Progress Notes
Read By - Daniel Siragusa M.D.  Electronically Verified By - Daniel Siragusa M.D.  Released Date Time - 12/05/2016 7:10 PM  Resident - Anushi Patel      Labs and imaging reviewed by me      Assessment and Plan:         # Right Second  Finger abscess -   - S/p D and I 12/06/16 - cultures sent from OR- showing MRSA , resistant to cleocin, change Abx to Bactrim 2 Tabs BID - Rx for 14 days in total .   - D/c vancomycin    - NWB RUE, elevate , whirlpool therapy   - Pain control. Add motrin.   ?  # Sepsis - Leucocytosis and intermittent tachycardia- resolved.   - No fever,.     # Constipation - likely narcotic induced. Bowel regimen.   ?  # smoker - counseled.     # DVT ppx     Dispo -   D/c planning once ortho clears  .    12/08/2016, 7:19 AM  Vinay Gagadam, M.D.  Hospital Medicine  P- [phone]

## 2016-12-09 MED ORDER — IBUPROFEN 600 MG PO TABS
600 mg | Freq: Four times a day (QID) | ORAL | 0 refills | Status: CP | PRN
Start: 2016-12-09 — End: ?

## 2016-12-09 MED ORDER — SULFAMETHOXAZOLE-TRIMETHOPRIM 800-160 MG PO TABS
2 | ORAL_TABLET | Freq: Two times a day (BID) | ORAL | 0 refills | Status: CP
Start: 2016-12-09 — End: ?

## 2016-12-09 MED ORDER — HYDROCODONE-ACETAMINOPHEN 5-325 MG PO TABS
1 | ORAL_TABLET | Freq: Three times a day (TID) | ORAL | 0 refills | Status: CP | PRN
Start: 2016-12-09 — End: ?

## 2016-12-09 NOTE — Discharge Summary
Steve Garza

## 2016-12-09 NOTE — Discharge Summary
Vital signs: BP 128/78  - Pulse 79  - Temp 36.5 ?C (97.7 ?F) (Oral)  - Resp 18  - Ht 1.778 m (5\' 10" )  - Wt 107.5 kg (237 lb)  - SpO2 98%  - BMI 34.01 kg/m? , 98%    General: Alert and awake    Head: Normocephalic, atraumatic    Cardio-vascular: S1, S2 heard, Normal rate currently    Respiratory: Bilateral airway entry present, Equal breath sounds on both sides  No gross wheezing or rales heard    Abdomen: Soft, Non distended, Non tender, Bowel sounds present, No guarding or rigidity noted    Extremities: No calf tenderness, No pedal edema    Neuro: Alert and Oriented times 3, Grossly non focal on exam    Psychiatry: co-operative       Steve PateSumner, Steve Garza   Home Medication Instructions [id number]    Printed on:12/09/16 1251   Medication Information                      HYDROcodone-acetaminophen (NORCO) 5-325 MG PO Tablet  Take 1 tablet by mouth every 8 hours as needed.             ibuprofen (ADVIL,MOTRIN) 600 MG PO Tablet  Take 1 tablet by mouth every 6 hours as needed.             sulfamethoxazole-trimethoprim (BACTRIM DS,SEPTRA DS) 800-160 MG PO Tablet  Take 2 tablets by mouth every 12 hours for 9 days.                   Discharge Diagnoses:    ? Right second digit abscess s/p d and I  ? Tobacco abuse   ? Constipation     Discharge Disposition: Home    Discharge instructions:    Follow up with Mur[place], Marella Chimesuaine Duchamp in 5 - 7 days after discharge  Follow up with Ortho as scheduled.     Patient was advised regarding Medication compliance and compliance to the doctor's visits as well as compliance to the appropriate diet.    All the discharge instructions were discussed with the patient in detail on the day of discharge who understood and verbalized understanding.    Spent more than 34 minutes in the discharge process of Steve [name]. Please send the History & Physical, pertinent labs, radiology   Discharge Summary to the Patient's Primary care physician. Please call me with any questions.      12/09/2016, 12:51 PM

## 2016-12-09 NOTE — Plan of Care
Problem: Pain, Acute & Chronic  Goal: Pain is relieved/acceptable level with minimal side effects  Outcome: Ongoing  Medicating patient with PRN pain medication per MAR. Patient able to verbalize pain rating using scale 0-10.   Will continue to assess pt.

## 2016-12-09 NOTE — Plan of Care
Pt understood discharge instruction. IV removed and catheter tip intact. Pt has all belongings and precriptions. Pt was given wound care supplies and educated on how to change dressing. Pt stated he was already familar with how to change dressing.

## 2016-12-09 NOTE — Progress Notes
Physical Therapy Wound Care Note       Start Time (min): 1410  End Time (min): 1440  Total Time (min): 30    Room/Bed: 456/456-01  Admit Date: 12/05/2016  Current Medical Condition: No changes     Past Medical History:   Diagnosis Date   ? High cholesterol      Past Surgical History:   Procedure Laterality Date   ? DEBRIDEMENT OF ANY BODY PART Right 12/06/2016    DEBRIDEMENT OF RIGHT INDEX FINGER performed by Helms, [name], MD at [facility]       Precautions:  ? Contact  ? FALL    Extremity Precautions:  ? Right Upper Extremity: Non-Weight Bearing    Orthotic, Protective, & Supportive Devices:  ? None    Subjective: "Going home today."  ? Pain Pre-Treatment: Yes, 2/10, Location: R hand , Intervention: N/A   ? Pain Post-Treatment: 2/10    Cognition:   ? Alert and Oriented to Person, Place, Time and Situation  ? Command Following: Follows Multi-step commands    Objective:     Wound Evaulation:  ? Location: R index finger  ? Photograph: Yes. See images in Media Tab. Reference: R index finger 12/09/16  ? Length: 0.8cm, Width: 0.4cm, Depth: 0.3cm  ? Undermining: No  ? Tunneling: No  ? Wound Base: Granulating: 75%, Slough: 25%  ? Peri-wound Tissue: Macerated wound edges  ? Drainage: Minimal serous  ? Odor: None  ? Applied Dressings: Dry gauze with ACE.    Treatment: Received whirlpool with HydroClor additive x15min. Tweezer debridement of loose slough and skin.    Therapeutic Exercises: NA     Education: PT areas patient educated on: precautions and home wound management    Outcome Measures:  NOT TESTED    Assessment: Patient wound decrease in depth and packing not needed. Improving granulation. Patient to continue with daily home dressing changes and follow up with ortho.    Problem list:  ? open wound    Other Recommended Services: NA    Discharge Recommendations:   ? Discharge Disposition: Home with intermittent caregiver supervision    ? DME Recommendations:  1. None

## 2016-12-09 NOTE — Discharge Summary
Department of Medicine                                Hospitalist Service                                 Discharge Summary      Patient Name : Steve Garza  Date of Birth : 08/27/1990    Primary care physician: Murphree, Duaine Duchamp    Date of Admission: 12/05/2016    Date of Discharge: 12/09/16       Past Medical History:   Diagnosis Date   ? High cholesterol          Hospital Course:   ?  # Right Second  Finger abscess -   - S/p D and I 12/06/16 - cultures sent from OR- showing MRSA , resistant to cleocin,   Abx to Bactrim 2 Tabs BID - Rx for 14 days in total .   - s/p  vancomycin    - NWB RUE, elevate , s/p whirlpool therapy   - Pain control. Add motrin.   ?  # Sepsis - Leucocytosis and intermittent tachycardia- resolved.   - No fever .   ?  # Constipation - likely narcotic induced. Bowel regimen. And moved bowels.   ?  # smoker - counseled.   ?  # DVT ppx   ?  D/w ortho - OK for d/c and OP follow up. Wound care supplies, Norco , motrin and bactrim. Advised no weight bearing and to follow up as OP .       Following consults during the Hospital course    Consults   Procedures   ? Orthopedic Surgery Consult (Auto-Page)   ? Hospitalist Consult (Auto-Page)       Per Radiology: Xr Hand Right 3 Views    Result Date: 12/05/2016  EXAMINATION: XR HAND RIGHT 3 VIEWS CLINICAL INFORMATION: Infection. TECHNIQUE: 3 views of the right hand COMPARISON: None. FINDINGS: Normal anatomic alignment. No acute fracture or subluxation. Joint spaces are preserved. There is soft tissue swelling of the second digit. No radiopaque foreign body. Evidence of old fifth metacarpal and additional old carpal fractures.     No acute fracture. Soft tissue swelling of the second digit. I personally reviewed the images and the residents findings and agree with the above. Read By - Daniel Siragusa M.D.  Electronically Verified By - Daniel Siragusa M.D.  Released Date Time - 12/05/2016 7:10 PM  Resident - Anushi Patel      PHYSICAL EXAM:

## 2016-12-09 NOTE — Progress Notes
Department of Orthopedics   Progress Note    Admit Date: 12/05/2016    LOS: 3 days     Assessment and Plan:     26 y.o. male  with [name].    -Pain control: per primary  -Diet: per primary  -Abx: per primary  -Wound Cx: MRSA  -Wound Smear: GPC  -NWB R Hand  -Elevate R Hand  -PT/OT to eval and treat: whirlpool therapy  -DVT ppx: per primary  -IS 10x/hr, nursing to demonstrate proper technique   -daily dressing changed by ortho MD today   -further dressing changes per nursing (gauze, ACE)    -Follow up with Dr. Helms of [facility] in 1 week for wound check. Please have patient call [phone] to schedule an appointment.       Subjective:     NAEON. Tolerating PO. Voiding. States his pain and swelling are much improved after whirlpool therapy. Can pinch his thumb to finger. Doing well. Wants to go home.    Patient denies N/V, F/C, chest pain, pressure, SOB and night sweats.     Objective:     Vital Signs: Last Filed Vitals Signs: 24 Hour Range   BP: 122/78 (08/14 0610) BP: (116-128)/(62-82)    Temp: 36.8 ?C (98.2 ?F) (08/14 0610) Temp:  [36.7 ?C (98 ?F)-37.1 ?C (98.7 ?F)]    Pulse: 87 (08/14 0610) Pulse:  [87-117]    Resp: 20 (08/14 0610) Resp:  [15-20]    SpO2: 100 % (08/14 0610) SpO2:  [98 %-100 %]            Date 12/08/16 0700 - 12/09/16 0659 12/09/16 0700 - 12/10/16 0659   Shift 0700-1459 1500-2259 2300-0659 24 Hour Total 0700-1459 1500-2259 2300-0659 24 Hour Total   I  N  T  A  K  E   P.O. 440 480  920          P.O. 440 480  920        Shift Total 440 480  920       O  U  T  P  U  T   Urine              Urine Occurrence 2 x 1 x  3 x        Stool              Stool Occurrence  1 x  1 x        Shift Total           NET 440 480  920         Results for orders placed or performed during the hospital encounter of 12/05/16   Culture, Anaerobic   Result Value Ref Range    Culture Result Culture negative    Culture, Anaerobic   Result Value Ref Range    Culture Result Culture negative

## 2016-12-09 NOTE — Discharge Summary
Department of Medicine                                Hospitalist Service                                 Discharge Summary      Patient Name : Steve Garza  Date of Birth : Oct 13, 1990    Primary care physician: Merilynn FinlandMurphree, Duaine Duchamp    Date of Admission: 12/05/2016    Date of Discharge: 12/09/16       Past Medical History:   Diagnosis Date   ? High cholesterol          Hospital Course:   ?  # Right Second  Finger abscess -   - S/p D and I 12/06/16 - cultures sent from OR- showing MRSA , resistant to cleocin,   Abx to Bactrim 2 Tabs BID - Rx for 14 days in total .   - s/p  vancomycin    - NWB RUE, elevate , s/p whirlpool therapy   - Pain control. Add motrin.   ?  # Sepsis - Leucocytosis and intermittent tachycardia- resolved.   - No fever .   ?  # Constipation - likely narcotic induced. Bowel regimen. And moved bowels.   ?  # smoker - counseled.   ?  # DVT ppx   ?  D/w ortho - OK for d/c and OP follow up. Wound care supplies, Norco , motrin and bactrim. Advised no weight bearing and to follow up as OP .       Following consults during the Hospital course    Consults   Procedures   ? Orthopedic Surgery Consult (Auto-Page)   ? Hospitalist Consult (Auto-Page)       Per Radiology: Xr Hand Right 3 Views    Result Date: 12/05/2016  EXAMINATION: XR HAND RIGHT 3 VIEWS CLINICAL INFORMATION: Infection. TECHNIQUE: 3 views of the right hand COMPARISON: None. FINDINGS: Normal anatomic alignment. No acute fracture or subluxation. Joint spaces are preserved. There is soft tissue swelling of the second digit. No radiopaque foreign body. Evidence of old fifth metacarpal and additional old carpal fractures.     No acute fracture. Soft tissue swelling of the second digit. I personally reviewed the images and the residents findings and agree with the above. Read By Shirlee More- Daniel Siragusa M.D.  Electronically Verified By Shirlee More- Daniel Siragusa M.D.  Released Date Time - 12/05/2016 7:10 PM  Resident - Lora PaulaAnushi Patel      PHYSICAL EXAM:

## 2016-12-09 NOTE — Plan of Care
Problem: Wound  Goal: Patient will demonstrate decreased wound size  Patient will demonstrate decreased wound size on R index finger by 50% to promote wound healing.      Outcome: Met/Completed Date Met: 12/09/16

## 2016-12-09 NOTE — Discharge Summary
Vital signs: BP 128/78  - Pulse 79  - Temp 36.5 ?C (97.7 ?F) (Oral)  - Resp 18  - Ht 1.778 m (5' 10")  - Wt 107.5 kg (237 lb)  - SpO2 98%  - BMI 34.01 kg/m? , 98%    General: Alert and awake    Head: Normocephalic, atraumatic    Cardio-vascular: S1, S2 heard, Normal rate currently    Respiratory: Bilateral airway entry present, Equal breath sounds on both sides  No gross wheezing or rales heard    Abdomen: Soft, Non distended, Non tender, Bowel sounds present, No guarding or rigidity noted    Extremities: No calf tenderness, No pedal edema    Neuro: Alert and Oriented times 3, Grossly non focal on exam    Psychiatry: co-operative       [name], [name]   Home Medication Instructions HAR:[id number]    Printed on:12/09/16 1251   Medication Information                      HYDROcodone-acetaminophen (NORCO) 5-325 MG PO Tablet  Take 1 tablet by mouth every 8 hours as needed.             ibuprofen (ADVIL,MOTRIN) 600 MG PO Tablet  Take 1 tablet by mouth every 6 hours as needed.             sulfamethoxazole-trimethoprim (BACTRIM DS,SEPTRA DS) 800-160 MG PO Tablet  Take 2 tablets by mouth every 12 hours for 9 days.                   Discharge Diagnoses:    ? Right second digit abscess s/p d and I  ? Tobacco abuse   ? Constipation     Discharge Disposition: Home    Discharge instructions:    Follow up with Murphree, Duaine Duchamp in 5 - 7 days after discharge  Follow up with Ortho as scheduled.     Patient was advised regarding Medication compliance and compliance to the doctor's visits as well as compliance to the appropriate diet.    All the discharge instructions were discussed with the patient in detail on the day of discharge who understood and verbalized understanding.    Spent more than 34 minutes in the discharge process of Steve Garza. Please send the History & Physical, pertinent labs, radiology   Discharge Summary to the Patient's Primary care physician. Please call me with any questions.      12/09/2016, 12:51 PM

## 2016-12-09 NOTE — Progress Notes
Department of Orthopedics   Progress Note    Admit Date: 12/05/2016    LOS: 3 days     Assessment and Plan:     26 y.o. male  with [name].    -Pain control: per primary  -Diet: per primary  -Abx: per primary  -Wound Cx: MRSA  -Wound Smear: GPC  -NWB R Hand  -Elevate R Hand  -PT/OT to eval and treat: whirlpool therapy  -DVT ppx: per primary  -IS 10x/hr, nursing to demonstrate proper technique   -daily dressing changed by ortho MD today   -further dressing changes per nursing (gauze, ACE)    -Follow up with Dr. Anastasia FiedlerHelms of Ortho Trauma in 1 week for wound check. Please have patient call [phone] to schedule an appointment.       Subjective:     NAEON. Tolerating PO. Voiding. States his pain and swelling are much improved after whirlpool therapy. Can pinch his thumb to finger. Doing well. Wants to go home.    Patient denies N/V, F/C, chest pain, pressure, SOB and night sweats.     Objective:     Vital Signs: Last Filed Vitals Signs: 24 Hour Range   BP: 122/78 (08/14 0610) BP: (116-128)/(62-82)    Temp: 36.8 ?C (98.2 ?F) (08/14 0610) Temp:  [36.7 ?C (98 ?F)-37.1 ?C (98.7 ?F)]    Pulse: 87 (08/14 0610) Pulse:  [87-117]    Resp: 20 (08/14 0610) Resp:  [15-20]    SpO2: 100 % (08/14 0610) SpO2:  [98 %-100 %]            Date 12/08/16 0700 - 12/09/16 0659 12/09/16 0700 - 12/10/16 0659   Shift 0700-1459 1500-2259 2300-0659 24 Hour Total 0700-1459 1500-2259 2300-0659 24 Hour Total   I  N  T  A  K  E   P.O. 440 480  920          P.O. 440 480  920        Shift Total 440 480  920       O  U  T  P  U  T   Urine              Urine Occurrence 2 x 1 x  3 x        Stool              Stool Occurrence  1 x  1 x        Shift Total           NET 440 480  920         Results for orders placed or performed during the hospital encounter of 12/05/16   Culture, Anaerobic   Result Value Ref Range    Culture Result Culture negative    Culture, Anaerobic   Result Value Ref Range    Culture Result Culture negative

## 2016-12-09 NOTE — Plan of Care
Paged Hospitalist Gagadam that Pt refused lovenox, nicotine patch,miralax and pericolace. Will continue to monitor pt.

## 2016-12-09 NOTE — Plan of Care
Paged Hospitalist Gagadam that Pt refused lovenox, nicotine patch,miralax and pericolace. Will continue to monitor pt.

## 2016-12-09 NOTE — Progress Notes
**  Note: Discharge recommendations may change based on patient progress. Please refer to the most updated progress note for current discharge recommendations.     Post Treatment:   Patient Position/Safety: walked back to room    Frequency: Patient will be discharged from PT services.    _________________________  Jeanmarie Hubertaymond A Go, PT  12/09/2016

## 2016-12-09 NOTE — Progress Notes
Culture, Wound   Result Value Ref Range    Culture Result       Methicillin/Oxacillin Resistant Staphylococcus aureus    Direct Smear Suggests Moderate neutrophils     Direct Smear Suggests No squamous epithelial cells     Direct Smear Suggests       Gram-positive cocci in clusters suggestive of Staphylococcus       Susceptibility    Methicillin/Oxacillin Resistant Staphylococcus aureus -  (no method available)     Oxacillin  Resistant      Clindamycin  Resistant      Erythromycin  Resistant      Trimethoprim/Sulfamethoxazole  Susceptible      Vancomycin  Susceptible      Tetracycline  Susceptible    Culture, Wound   Result Value Ref Range    Culture Result       Methicillin/Oxacillin Resistant Staphylococcus aureus    Direct Smear Suggests Moderate neutrophils     Direct Smear Suggests No squamous epithelial cells     Direct Smear Suggests       Gram-positive cocci in clusters suggestive of Staphylococcus   Fungus Culture   Result Value Ref Range    Culture Result No fungus isolated     Fungus Stain No fungal elements seen    Fungus Culture   Result Value Ref Range    Culture Result No fungus isolated     Fungus Stain No fungal elements seen          Physical Exam  General: Alert, oriented x 3, no acute distress  Respiratory: No increased work of breathing, symmetrical chest rise   Cardiovascular: Regular rate    Right Upper Extremity:   2+ radial pulse. <2sec cap refill   ROM at index finger improving   SILT present in M/U/R/A nerve distributions.    +Motor M/U/R/A/PIN/AIN nerve distributions.   2cm incision to dorsal index finger.   Mild edema present proximally to level of wrist.   No erythema.    Tender to palpation of index finger and dorsally to level of mid hand.    No deformities or crepitus.   Compartments soft/compressible.         Colson Sanderson, MD  6:03 AM  12/09/2016

## 2016-12-09 NOTE — Progress Notes
**  Note: Discharge recommendations may change based on patient progress. Please refer to the most updated progress note for current discharge recommendations.     Post Treatment:   Patient Position/Safety: walked back to room    Frequency: Patient will be discharged from PT services.    _________________________  Raymond A Go, PT  12/09/2016

## 2016-12-09 NOTE — Progress Notes
Physical Therapy Wound Care Note       Start Time (min): 1410  End Time (min): 1440  Total Time (min): 30    Room/Bed: 456/456-01  Admit Date: 12/05/2016  Current Medical Condition: No changes     Past Medical History:   Diagnosis Date   ? High cholesterol      Past Surgical History:   Procedure Laterality Date   ? DEBRIDEMENT OF ANY BODY PART Right 12/06/2016    DEBRIDEMENT OF RIGHT INDEX FINGER performed by Sharmaine BaseHelms, [name], MD at [facility]       Precautions:  ? Contact  ? FALL    Extremity Precautions:  ? Right Upper Extremity: Non-Weight Bearing    Orthotic, Protective, & Supportive Devices:  ? None    Subjective: "Going home today."  ? Pain Pre-Treatment: Yes, 2/10, Location: R hand , Intervention: N/A   ? Pain Post-Treatment: 2/10    Cognition:   ? Alert and Oriented to Person, Place, Time and Situation  ? Command Following: Follows Multi-step commands    Objective:     Wound Evaulation:  ? Location: R index finger  ? Photograph: Yes. See images in Media Tab. Reference: R index finger 12/09/16  ? Length: 0.8cm, Width: 0.4cm, Depth: 0.3cm  ? Undermining: No  ? Tunneling: No  ? Wound Base: Granulating: 75%, Slough: 25%  ? Peri-wound Tissue: Macerated wound edges  ? Drainage: Minimal serous  ? Odor: None  ? Applied Dressings: Dry gauze with ACE.    Treatment: Received whirlpool with HydroClor additive x2915min. Tweezer debridement of loose slough and skin.    Therapeutic Exercises: NA     Education: PT areas patient educated on: precautions and home wound management    Outcome Measures:  NOT TESTED    Assessment: Patient wound decrease in depth and packing not needed. Improving granulation. Patient to continue with daily home dressing changes and follow up with ortho.    Problem list:  ? open wound    Other Recommended Services: NA    Discharge Recommendations:   ? Discharge Disposition: Home with intermittent caregiver supervision    ? DME Recommendations:  1. None

## 2016-12-09 NOTE — Plan of Care
Pt understood discharge instruction. IV removed and catheter tip intact. Pt has all belongings and precriptions. Pt was given wound care supplies and educated on how to change dressing. Pt stated he was already familar with how to change dressing.

## 2016-12-09 NOTE — Progress Notes
Culture, Wound   Result Value Ref Range    Culture Result       Methicillin/Oxacillin Resistant Staphylococcus aureus    Direct Smear Suggests Moderate neutrophils     Direct Smear Suggests No squamous epithelial cells     Direct Smear Suggests       Gram-positive cocci in clusters suggestive of Staphylococcus       Susceptibility    Methicillin/Oxacillin Resistant Staphylococcus aureus -  (no method available)     Oxacillin  Resistant      Clindamycin  Resistant      Erythromycin  Resistant      Trimethoprim/Sulfamethoxazole  Susceptible      Vancomycin  Susceptible      Tetracycline  Susceptible    Culture, Wound   Result Value Ref Range    Culture Result       Methicillin/Oxacillin Resistant Staphylococcus aureus    Direct Smear Suggests Moderate neutrophils     Direct Smear Suggests No squamous epithelial cells     Direct Smear Suggests       Gram-positive cocci in clusters suggestive of Staphylococcus   Fungus Culture   Result Value Ref Range    Culture Result No fungus isolated     Fungus Stain No fungal elements seen    Fungus Culture   Result Value Ref Range    Culture Result No fungus isolated     Fungus Stain No fungal elements seen          Physical Exam  General: Alert, oriented x 3, no acute distress  Respiratory: No increased work of breathing, symmetrical chest rise   Cardiovascular: Regular rate    Right Upper Extremity:   2+ radial pulse. <2sec cap refill   ROM at index finger improving   SILT present in M/U/R/A nerve distributions.    +Motor M/U/R/A/PIN/AIN nerve distributions.   2cm incision to dorsal index finger.   Mild edema present proximally to level of wrist.   No erythema.    Tender to palpation of index finger and dorsally to level of mid hand.    No deformities or crepitus.   Compartments soft/compressible.         Ashok Cordiaody Sanderson, MD  6:03 AM  12/09/2016

## 2016-12-15 ENCOUNTER — Inpatient Hospital Stay: Admit: 2016-12-15 | Discharge: 2016-12-15

## 2016-12-15 DIAGNOSIS — F17210 Nicotine dependence, cigarettes, uncomplicated: Secondary | ICD-10-CM

## 2016-12-15 DIAGNOSIS — E7800 Pure hypercholesterolemia, unspecified: Principal | ICD-10-CM

## 2016-12-15 DIAGNOSIS — R197 Diarrhea, unspecified: Secondary | ICD-10-CM

## 2016-12-15 DIAGNOSIS — K521 Toxic gastroenteritis and colitis: Secondary | ICD-10-CM

## 2016-12-15 DIAGNOSIS — R112 Nausea with vomiting, unspecified: Principal | ICD-10-CM

## 2016-12-15 DIAGNOSIS — L02511 Cutaneous abscess of right hand: Secondary | ICD-10-CM

## 2016-12-15 MED ORDER — CLINDAMYCIN HCL 150 MG PO CAPS
300 mg | Freq: Three times a day (TID) | ORAL | 0 refills | Status: CP
Start: 2016-12-15 — End: ?

## 2016-12-15 MED ORDER — ONDANSETRON 4 MG PO TBDP
4 mg | Freq: Three times a day (TID) | ORAL | 0 refills | Status: CP | PRN
Start: 2016-12-15 — End: ?

## 2016-12-15 MED ORDER — ONDANSETRON HCL 4 MG/2ML IJ SOLN
4 mg | Freq: Once | INTRAVENOUS | Status: CP
Start: 2016-12-15 — End: ?

## 2016-12-15 MED ORDER — BOLUS IV FLUID JX
Freq: Once | INTRAVENOUS | Status: CP
Start: 2016-12-15 — End: ?

## 2016-12-15 NOTE — ED Provider Notes
Musculoskeletal: Normal range of motion. He exhibits no edema, tenderness or deformity.   Neurological: He is alert and oriented to person, place, and time.   Nursing note and vitals reviewed.      Differential DDx: dehydration, electrolyte imbalance    Is this an Emergent Medical Condition? No - Non-Emergent  409.901 FS  641.19 FS  627.732 (16) FS    ED Workup   Procedures    Labs:  -   BASIC METABOLIC PANEL - Abnormal        Result Value Ref Range    Sodium 137  135 - 145 mmol/L    Potassium 4.0  3.3 - 4.6 mmol/L    Chloride 100 (*) 101 - 110 mmol/L    CO2 27  21 - 29 mmol/L    Urea Nitrogen 12  6 - 22 mg/dL    Creatinine 0.90  0.67 - 1.17 mg/dL    BUN/Creatinine Ratio 13.3  6.0 - 22.0 (calc)    Glucose 102 (*) 71 - 99 mg/dL    Calcium 9.3  8.6 - 10.0 mg/dL    Osmolality Calc 273.8      Anion Gap 10  4 - 16 mmol/L    EGFR >59  mL/min/1.73M2    Comment:   Reference range: =>90 ml/min/1.73M2  eGFR estimates are unable to accurately differentiate levels of GFR above 60 ml/min/1.73M2.   URINALYSIS W/MICROSCOPY - Abnormal     Color -Ur Yellow  Amber    Clarity, UA Clear  Hazy    Specific Gravity, Urine 1.011  1.003 - 1.030    pH, Urine 6.0  4.5 - 8.0    Protein-UA Negative  Negative mg/dL    Glucose -Ur Negative  Negative mg/dL    Ketones UA Negative  Negative mg/dL    Bilirubin -Ur Negative  Negative    Blood -Ur Negative  Negative    Nitrite -Ur Negative  Negative    Urobilinogen -Ur Normal  Normal    Leukocytes -Ur Negative  Negative    RBC -Ur 2  0 - 5 /HPF    WBC -Ur 0  0 - 5 HPF    Bacteria -Ur Rare (*) None seen /HPF    Mucus -Ur Rare  2+ /LPF    ASCORBIC ACID Negative  20 mg/dL   CBC AUTODIFF - Abnormal     WBC 7.34  4.5 - 11 x10E3/uL    RBC 4.61  4.50 - 6.30 x10E6/uL    Hemoglobin 14.4  14.0 - 18.0 g/dL    Hematocrit 39.6 (*) 40.0 - 54.0 %    MCV 85.9  82.0 - 101.0 fl    MCH 31.2  27.0 - 34.0 pg    MCHC 36.4 (*) 31.0 - 36.0 g/dL    RDW 12.2  12.0 - 16.1 %    Platelet Count 264  140 - 440 thou/cu mm

## 2016-12-15 NOTE — ED Provider Notes
Discussed patient with NON-ED Provider: None      ED Disposition   ED Disposition: Discharge      ED Clinical Impression   ED Clinical Impression:   Nausea and vomiting, intractability of vomiting not specified, unspecified vomiting type  Diarrhea due to drug  Abscess of finger of right hand      ED Patient Status   Patient Status:   Good        ED Medical Evaluation Initiated   Medical Evaluation Initiated:  Yes, filed at 12/15/16 1137  by Lossie FaesPehlivanovic, Mirna, PA-C

## 2016-12-15 NOTE — ED Provider Notes
Is this an Emergent Medical Condition? {SH ED EMERGENT MEDICAL CONDITION:[phone]}  409.901 FS  641.19 FS  627.732 (16) FS    ED Workup   Procedures    Labs:  - - No data to display      Imaging (Read by ED Provider):  {Imaging findings:[phone]}      EKG (Read by ED Provider):  {EKG findings:[id number]}        ED Course & Re-Evaluation     ED Course          MDM   Decide to obtain history from someone other than the patient: {SH ED Lamonte SakaiJX MDM - OBTAIN [id number]:19147}HISTORY:28378}    Decide to obtain previous medical records: [facility]{SH ED Lamonte SakaiJX MDM - PREVIOUS MED REC - NO WGN:56213}YES:28380}    Clinical Lab Test(s): {SH ED Lamonte SakaiJX MDM ORDERED AND REVIEWED:28124}    Diagnostic Tests (Radiology, EKG): {SH ED Lamonte SakaiJX MDM ORDERED AND REVIEWED:28124}    Independent Visualization (ED US, Wet Prep, Other): {SH ED Lamonte SakaiJX MDM NO YES [id number]:96295}WILDCARD:26444}    Discussed patient with NON-ED Provider: {SH ED Lamonte SakaiJX MDM - ANOTHER PROVIDER:28381}      ED Disposition   ED Disposition: No ED Disposition Set      ED Clinical Impression   ED Clinical Impression:   No Clinical Impression Set      ED Patient Status   Patient Status:   [phone]}        ED Medical Evaluation Initiated   Medical Evaluation Initiated:  Yes, filed at 12/15/16 1137  by Lossie FaesPehlivanovic, Mirna, PA-C

## 2016-12-15 NOTE — ED Triage Notes
Pt to room 03. No acute distress. Ax4. Pt reports that he has been vomiting and having diarrhea. Pt reports he was discharge from the hospital a week ago and was giving Bactrim to go home due to wound on his hand. Pt reports he belives the medication is making him sick. No active vomiting at this time. Call bell within reach. Will follow orders and monitor pt.

## 2016-12-15 NOTE — ED Provider Notes
History     Chief Complaint   Patient presents with   ? Vomiting   ? Diarrhea       26 year old male, just recently placed on abx for cellulitis/ debridem,ent of his right index finger 1 week ago presents to ED with cc of nausea/ vomiting/ diarrhea. Per patient he states he started Bactrim DS about 1 week ago and about 4 days ago he started having nasue and diarrhea and then started having vomiting 3 days ago. He states the vomiting was very bad last night. He states he feels a little light headed and weak from vomiting so much. He denies any acute abdominal pain at all. He denies fevers, chills, headche, syncope, loc, dysuria, hematuria, blood in stool, dizziness, paralysis, numbness or weakness. He states the incision is healing well, no complications.             No Known Allergies    Patient's Medications   New Prescriptions    No medications on file   Previous Medications    HYDROCODONE-ACETAMINOPHEN (NORCO) 5-325 MG PO TABLET    Take 1 tablet by mouth every 8 hours as needed.    IBUPROFEN (ADVIL,MOTRIN) 600 MG PO TABLET    Take 1 tablet by mouth every 6 hours as needed.    SULFAMETHOXAZOLE-TRIMETHOPRIM (BACTRIM DS,SEPTRA DS) 800-160 MG PO TABLET    Take 2 tablets by mouth every 12 hours for 9 days.   Modified Medications    No medications on file   Discontinued Medications    No medications on file       Past Medical History:   Diagnosis Date   ? High cholesterol        Past Surgical History:   Procedure Laterality Date   ? DEBRIDEMENT OF ANY BODY PART Right 12/06/2016    DEBRIDEMENT OF RIGHT INDEX FINGER performed by Sharmaine BaseHelms, [name][facility]       No family history on file.    Social History     Social History   ? Marital status: Married     Spouse name: N/A   ? Number of children: N/A   ? Years of education: N/A     Social History Main Topics   ? Smoking status: Current Every Day Smoker     Packs/day: 1.00     Types: Cigarettes   ? Smokeless tobacco: Never Used   ? Alcohol use Yes

## 2016-12-15 NOTE — ED Provider Notes
?   Years of education: N/A     Social History Main Topics   ? Smoking status: Current Every Day Smoker     Packs/day: 1.00     Types: Cigarettes   ? Smokeless tobacco: Never Used   ? Alcohol use Yes      Comment: rarely   ? Drug use: No   ? Sexual activity: Not Asked     Other Topics Concern   ? None     Social History Narrative   ? None       Review of Systems   Constitutional: Negative for fever, chills and fatigue.   HENT: Negative.    Eyes: Negative.    Respiratory: Negative for cough, shortness of breath, wheezing and stridor.    Cardiovascular: Negative for chest pain, palpitations and leg swelling.   Gastrointestinal: Positive for nausea, vomiting and diarrhea. Negative for blood in stool, abdominal distention and anal bleeding.   Genitourinary: Negative for dysuria, hematuria and flank pain.   Musculoskeletal: Negative.    Skin: Negative for color change, pallor, rash and wound.   Neurological: Positive for light-headedness. Negative for dizziness, tremors, syncope, weakness, numbness and headaches.   Allergic/Immunologic: negative.        Physical Exam     ED Triage Vitals [12/15/16 1112]   BP 127/80   Pulse 88   Resp 20   Temp 36.4 ?C (97.6 ?F)   Temp src Oral   Height 1.778 m   Weight 104.3 kg   SpO2 99 %   BMI (Calculated) 33.07             Physical Exam   Constitutional: He is oriented to person, place, and time. He appears well-developed and well-nourished.   HENT:   Head: Normocephalic.   Eyes: Conjunctivae and EOM are normal. Pupils are equal, round, and reactive to light.   Neck: Normal range of motion. Neck supple. No tracheal deviation present. No thyromegaly present.   Cardiovascular: Normal rate, regular rhythm, normal heart sounds and intact distal pulses.    Pulmonary/Chest: Effort normal and breath sounds normal. No respiratory distress. He has no wheezes. He has no rales.   Abdominal: Soft. Bowel sounds are normal. He exhibits no distension. There is no tenderness.

## 2016-12-15 NOTE — ED Attestation Note
Attestation   The patient was seen exclusively by the PA/ARNP, and was not seen by me. I was immediately available in the Emergency Department for consultation as needed.             Gus PumaPerry, Paul K, MD  12/15/16 [phone]

## 2016-12-15 NOTE — ED Provider Notes
Comment: rarely   ? Drug use: No   ? Sexual activity: Not Asked     Other Topics Concern   ? None     Social History Narrative   ? None       Review of Systems   Constitutional: Negative for fever, chills and fatigue.   HENT: Negative.    Eyes: Negative.    Respiratory: Negative for cough, shortness of breath, wheezing and stridor.    Cardiovascular: Negative for chest pain, palpitations and leg swelling.   Gastrointestinal: Positive for nausea, vomiting and diarrhea. Negative for blood in stool, abdominal distention and anal bleeding.   Genitourinary: Negative for dysuria, hematuria and flank pain.   Musculoskeletal: Negative.    Skin: Negative for color change, pallor, rash and wound.   Neurological: Positive for light-headedness. Negative for dizziness, tremors, syncope, weakness, numbness and headaches.   Allergic/Immunologic: negative.        Physical Exam     ED Triage Vitals [12/15/16 1112]   BP 127/80   Pulse 88   Resp 20   Temp 36.4 ?C (97.6 ?F)   Temp src Oral   Height 1.778 m   Weight 104.3 kg   SpO2 99 %   BMI (Calculated) 33.07             Physical Exam   Constitutional: He is oriented to person, place, and time. He appears well-developed and well-nourished.   HENT:   Head: Normocephalic.   Eyes: Conjunctivae and EOM are normal. Pupils are equal, round, and reactive to light.   Neck: Normal range of motion. Neck supple. No tracheal deviation present. No thyromegaly present.   Cardiovascular: Normal rate, regular rhythm, normal heart sounds and intact distal pulses.    Pulmonary/Chest: Effort normal and breath sounds normal. No respiratory distress. He has no wheezes. He has no rales.   Abdominal: Soft. Bowel sounds are normal. He exhibits no distension. There is no tenderness.   Musculoskeletal: Normal range of motion. He exhibits no edema, tenderness or deformity.   Neurological: He is alert and oriented to person, place, and time.   Nursing note and vitals reviewed.      Differential DDx: ***

## 2016-12-15 NOTE — ED Provider Notes
MPV 9.4 (*) 9.5 - 11.5 fl    nRBC % 0.0  0.0 - 1.0 %    Absolute NRBC Count 0.00      Neutrophils % 55.0  34.0 - 73.0 %    Lymphocytes % 27.9  25.0 - 45.0 %    Monocytes % 15.7 (*) 2.0 - 6.0 %    Eosinophils % 1.0  1.0 - 4.0 %    Immature Granulocytes % 0.3  0.0 - 2.0 %    Neutrophils Absolute 4.04  1.80 - 8.70 x10E3/uL    Lymphocytes Absolute 2.05  x10E3/uL    Monocytes Absolute 1.15  x10E3/uL    Eosinophils Absolute 0.07  x10E3/uL    Basophil Absolute 0.01  x10E3/uL    Absolute Immature Granulocytes 0.02 (*) 0 - 0 x10E3/uL    Basophils % 0.1  0 - 1 %   TROPONIN T (ED -ONLY) - Normal    Troponin T <0.01  0.00 - 0.04 ng/ml   LIPASE - Normal    Lipase 44  0 - 60 U/L   HEPATIC FUNCTION PANEL    Albumin 4.3  3.8 - 4.9 g/dL    Total Bilirubin 0.5  0.2 - 1.0 mg/dL    Bilirubin, Direct 0.2  0.0 - 0.2 mg/dL    Bilirubin, Indirect 0.3  8mg /dL mg/dL    Alkaline Phosphatase 82  40 - 129 IU/L    AST 24  14 - 33 IU/L    ALT 42  10 - 42 IU/L    Total Protein 7.4  6.5 - 8.3 g/dL    ALBUMIN/GLOBULIN RATIO 1.4  (calc)    Calc Total Globuin 3.1  gm/dL   CBC AND DIFFERENTIAL         Imaging (Read by ED Provider):  not applicable      EKG (Read by ED Provider):  not applicable        ED Course & Re-Evaluation     ED Course        Informed patient that all labs look good- patient has been afebrile and feels better after fluids and zofran. Informed patient we will switch his antibitoic to clindamycin and to follow up with his PCP and call his surgeon to make sure the switch is okay. Patient understands.   MDM   Decide to obtain history from someone other than the patient: No    Decide to obtain previous medical records: Yes - The review of the records selected below is documented in the ED Note : ( Prior ED visit )    Clinical Lab Test(s): Ordered and Reviewed    Diagnostic Tests (Radiology, EKG): N/A    Independent Visualization (ED US, Wet Prep, Other): Yes - Documented in ED Provider Note

## 2016-12-15 NOTE — ED Notes
Pt d/c @ 13:33pm. Pt given d/c paperwork w/ prescriptions. Pt IV removed. Pt given instructions. Pt understood all instructions given prior to d/c the patient ambulated of unit with all belongings.

## 2016-12-15 NOTE — ED Provider Notes
History     Chief Complaint   Patient presents with   ? Vomiting   ? Diarrhea       26 year old male, just recently placed on abx for cellulitis/ debridem,ent of his right index finger 1 week ago presents to ED with cc of nausea/ vomiting/ diarrhea. Per patient he states he started Bactrim DS about 1 week ago and about 4 days ago he started having nasue and diarrhea and then started having vomiting 3 days ago. He states the vomiting was very bad last night. He states he feels a little light headed and weak from vomiting so much. He denies any acute abdominal pain at all. He denies fevers, chills, headche, syncope, loc, dysuria, hematuria, blood in stool, dizziness, paralysis, numbness or weakness. He states the incision is healing well, no complications.             No Known Allergies    Patient's Medications   New Prescriptions    CLINDAMYCIN (CLEOCIN) 150 MG PO CAPSULE    Take 2 capsules by mouth 3 times daily for 7 days.    ONDANSETRON (ZOFRAN-ODT) 4 MG PO TABLET DISINTEGRATING    Dissolve 1 tablet in mouth every 8 hours as needed for nausea or vomiting.   Previous Medications    HYDROCODONE-ACETAMINOPHEN (NORCO) 5-325 MG PO TABLET    Take 1 tablet by mouth every 8 hours as needed.    IBUPROFEN (ADVIL,MOTRIN) 600 MG PO TABLET    Take 1 tablet by mouth every 6 hours as needed.    SULFAMETHOXAZOLE-TRIMETHOPRIM (BACTRIM DS,SEPTRA DS) 800-160 MG PO TABLET    Take 2 tablets by mouth every 12 hours for 9 days.   Modified Medications    No medications on file   Discontinued Medications    No medications on file       Past Medical History:   Diagnosis Date   ? High cholesterol        Past Surgical History:   Procedure Laterality Date   ? DEBRIDEMENT OF ANY BODY PART Right 12/06/2016    DEBRIDEMENT OF RIGHT INDEX FINGER performed by Sharmaine BaseHelms, [name]       No family history on file.    Social History     Social History   ? Marital status: Married     Spouse name: N/A   ? Number of children: N/A

## 2016-12-16 NOTE — ED Attestation Note
Attestation     Comments: I  performed  the limited soft tissue/musculoskeletal ultrasound examination as indicated for soft tissue edema. The ultrasound imaging protocol was performed as listed in the resident procedure note. I have discussed the findings with the resident and agree with the findings documented in the resident's note.  The final interpretation of those findings is/are soft tissue/musculoskeletal cellulitis.                 Schmidt, Andrew, DO  12/16/16 0742

## 2016-12-16 NOTE — ED Attestation Note
Attestation     Comments: I  performed  the limited soft tissue/musculoskeletal ultrasound examination as indicated for soft tissue edema. The ultrasound imaging protocol was performed as listed in the resident procedure note. I have discussed the findings with the resident and agree with the findings documented in the resident's note.  The final interpretation of those findings is/are soft tissue/musculoskeletal cellulitis.                 Renella CunasSchmidt, Andrew, DO  12/16/16 [phone]

## 2016-12-20 ENCOUNTER — Inpatient Hospital Stay: Attending: Orthopaedic Trauma | Primary: Family Medicine

## 2016-12-23 NOTE — ED Provider Notes
Schmidt, Andrew, DO 3:01 PM 12/05/2016        MDM   Decide to obtain history from someone other than the patient: No    Decide to obtain previous medical records: Yes - The review of the records selected below is documented in the ED Note : ( Prior ED visit )    Clinical Lab Test(s): N/A    Diagnostic Tests (Radiology, EKG): N/A    Independent Visualization (ED US, Wet Prep, Other): No    Discussed patient with NON-ED Provider: [facility]      ED Disposition   ED Disposition: ED CDU      ED Clinical Impression   ED Clinical Impression:   Cellulitis of finger of right hand  Right hand pain  Right wrist pain      ED Patient Status   Patient Status:   Fair        ED Medical Evaluation Initiated   Medical Evaluation Initiated:  Yes, filed at 12/05/16 1302  by Schmidt, Andrew, DO            Schmidt, Andrew, DO  12/05/16 1306    Scribe Attestation: I, Mikel Cress, have acted as a scribe for Schmidt, Andrew, DO 1:33 PM 12/05/2016     Physician Attestation: I,Andrew Christopher Schmidt, DO, have reviewed and confirmed the information stated by the scribe and made corrections and edits as appropriate.  I have personally provided the services documented by the scribe. 3:20 PM 12/05/2016            Schmidt, Andrew, DO  12/05/16 1520       Schmidt, Andrew, DO  12/05/16 1604       Schmidt, Andrew, DO  12/16/16 0742

## 2016-12-23 NOTE — ED Provider Notes
?   Smokeless tobacco: Never Used   ? Alcohol use Yes      Comment: rarely   ? Drug use: No   ? Sexual activity: Not Asked     Other Topics Concern   ? None     Social History Narrative   ? None       Review of Systems   Constitutional: Negative for fever and chills.   HENT: Negative for nasal congestion and sore throat.    Respiratory: Negative.  Negative for cough and shortness of breath.    Cardiovascular: Negative for chest pain.   Gastrointestinal: Negative for nausea, vomiting and abdominal pain.   Genitourinary: Negative for dysuria and frequency.   Musculoskeletal: Negative for arthralgias.   Skin: Positive for color change and wound (abscess to R index finger).   Neurological: Negative for weakness and numbness.   All other systems reviewed and are negative.      Physical Exam     ED Triage Vitals [12/05/16 1315]   BP 123/70   Pulse 85   Resp 16   Temp 36.6 ?C (97.9 ?F)   Temp src Oral   Height 1.778 m   Weight 101.6 kg   SpO2 97 %   BMI (Calculated) 32.21             Physical Exam   Constitutional: He is oriented to person, place, and time. He appears well-developed and well-nourished. No distress.   HENT:   Head: Normocephalic and atraumatic.   Eyes: Pupils are equal, round, and reactive to light.   Neck: Normal range of motion. Neck supple.   Cardiovascular: Regular rhythm and normal heart sounds.  Exam reveals no gallop and no friction rub.    No murmur heard.  Pulmonary/Chest: Effort normal and breath sounds normal. He has no wheezes. He has no rales. He exhibits no tenderness.   Abdominal: Soft. Bowel sounds are normal. He exhibits no distension. There is no tenderness. There is no rebound.   Musculoskeletal: Normal range of motion.        Hands:  Neurological: He is alert and oriented to person, place, and time. No cranial nerve deficit.   Skin: Skin is dry. He is not diaphoretic.   Psychiatric: He has a normal mood and affect. His behavior is normal. Judgment and thought content normal.

## 2016-12-23 NOTE — ED Provider Notes
MDM - OBTAIN HISTORY:28378}    Decide to obtain previous medical records: {SH ED JX MDM - PREVIOUS MED REC - NO YES:28380}    Clinical Lab Test(s): {SH ED JX MDM ORDERED AND REVIEWED:28124}    Diagnostic Tests (Radiology, EKG): {SH ED JX MDM ORDERED AND REVIEWED:28124}    Independent Visualization (ED US, Wet Prep, Other): {SH ED JX MDM NO YES WILDCARD:26444}    Discussed patient with NON-ED Provider: {SH ED JX MDM - ANOTHER PROVIDER:28381}      ED Disposition   ED Disposition: No ED Disposition Set      ED Clinical Impression   ED Clinical Impression:   No Clinical Impression Set      ED Patient Status   Patient Status:   {SH ED JX PATIENT STATUS:1602057102}        ED Medical Evaluation Initiated   Medical Evaluation Initiated:  Yes, filed at 12/05/16 1302  by Schmidt, Andrew, DO            Schmidt, Andrew, DO  12/05/16 1306

## 2016-12-23 NOTE — ED Provider Notes
History     Chief Complaint   Patient presents with   ? Cellulitis       26 y.o. male presents with c/o abscess to R index finger x 2 days after suspected spider bite. Seen in the ED yesterday and given Rx for Keflex and clindamycin. Patient notes increased swelling, redness, and pain to finger. Denies any drainage. No fever or N/V. States unable to afford ABx.      The history is provided by the patient.   Abscess   Location:  Finger  Finger abscess location:  R index finger  Abscess quality: painful and redness    Duration:  2 days  Progression:  Worsening  Pain details:     Quality:  Throbbing and tightness    Severity:  Moderate    Duration:  2 days    Timing:  Constant    Progression:  Worsening  Chronicity:  New  Relieved by:  Nothing  Worsened by:  Nothing  Ineffective treatments:  None tried      No Known Allergies    Patient's Medications   New Prescriptions    No medications on file   Previous Medications    No medications on file   Modified Medications    No medications on file   Discontinued Medications    CEPHALEXIN (KEFLEX) 500 MG PO CAPSULE    Take 1 capsule by mouth 4 times daily for 5 days.    CLINDAMYCIN (CLEOCIN) 150 MG PO CAPSULE    Take 2 capsules by mouth 3 times daily for 5 days.    NAPROXEN (NAPROSYN) 500 MG PO TABLET    Take 1 tablet by mouth 2 times daily as needed for other (pain).    TRAMADOL (ULTRAM) 50 MG PO TABLET    Take 1 tablet by mouth every 6 hours as needed for pain.       Past Medical History:   Diagnosis Date   ? High cholesterol        History reviewed. No pertinent surgical history.    History reviewed. No pertinent family history.    Social History     Social History   ? Marital status: Married     Spouse name: N/A   ? Number of children: N/A   ? Years of education: N/A     Social History Main Topics   ? Smoking status: Current Every Day Smoker     Packs/day: 1.00     Types: Cigarettes   ? Smokeless tobacco: Never Used   ? Alcohol use Yes      Comment: rarely

## 2016-12-23 NOTE — ED Provider Notes
Renella CunasSchmidt, Andrew, DO 3:01 PM 12/05/2016        MDM   Decide to obtain history from someone other than the patient: No    Decide to obtain previous medical records: Yes - The review of the records selected below is documented in the ED Note : ( Prior ED visit )    Clinical Lab Test(s): N/A    Diagnostic Tests (Radiology, EKG): N/A    Independent Visualization (ED US, Wet Prep, Other): No    Discussed patient with NON-ED Provider: [facility]      ED Disposition   ED Disposition: ED CDU      ED Clinical Impression   ED Clinical Impression:   Cellulitis of finger of right hand  Right hand pain  Right wrist pain      ED Patient Status   Patient Status:   Fair        ED Medical Evaluation Initiated   Medical Evaluation Initiated:  Yes, filed at 12/05/16 1302  by Renella CunasSchmidt, Andrew, DO            Renella CunasSchmidt, Andrew, DO  12/05/16 1306    Scribe Attestation: I, Jeffie PollockMikel Cress, have acted as a Neurosurgeonscribe for Renella CunasSchmidt, Andrew, DO 1:33 PM 12/05/2016     Physician Attestation: Wende CreaseI,Andrew Christopher Schmidt, DO, have reviewed and confirmed the information stated by the scribe and made corrections and edits as appropriate.  I have personally provided the services documented by the scribe. 3:20 PM 12/05/2016            Renella CunasSchmidt, Andrew, DO  12/05/16 1520       Renella CunasSchmidt, Andrew, DO  12/05/16 1604       Renella CunasSchmidt, Andrew, DO  12/16/16 [phone]

## 2016-12-23 NOTE — ED Provider Notes
History     Chief Complaint   Patient presents with   ? Cellulitis       26 y.o. male presents with c/o abscess to R index finger x 2 days after suspected spider bite. Seen in the ED yesterday and given Rx for Keflex and clindamycin. Patient notes increased swelling, redness, and pain to finger. Denies any drainage. No fever or N/V. States unable to afford ABx.      The history is provided by the patient.   Abscess   Location:  Finger  Finger abscess location:  R index finger  Abscess quality: painful and redness    Duration:  2 days  Progression:  Worsening  Pain details:     Quality:  Throbbing and tightness    Severity:  Moderate    Duration:  2 days    Timing:  Constant    Progression:  Worsening  Chronicity:  New  Relieved by:  Nothing  Worsened by:  Nothing  Ineffective treatments:  None tried  Associated symptoms: no fever, no nausea and no vomiting        No Known Allergies    Patient's Medications   New Prescriptions    No medications on file   Previous Medications    No medications on file   Modified Medications    No medications on file   Discontinued Medications    CEPHALEXIN (KEFLEX) 500 MG PO CAPSULE    Take 1 capsule by mouth 4 times daily for 5 days.    CLINDAMYCIN (CLEOCIN) 150 MG PO CAPSULE    Take 2 capsules by mouth 3 times daily for 5 days.    NAPROXEN (NAPROSYN) 500 MG PO TABLET    Take 1 tablet by mouth 2 times daily as needed for other (pain).    TRAMADOL (ULTRAM) 50 MG PO TABLET    Take 1 tablet by mouth every 6 hours as needed for pain.       Past Medical History:   Diagnosis Date   ? High cholesterol        History reviewed. No pertinent surgical history.    History reviewed. No pertinent family history.    Social History     Social History   ? Marital status: Married     Spouse name: N/A   ? Number of children: N/A   ? Years of education: N/A     Social History Main Topics   ? Smoking status: Current Every Day Smoker     Packs/day: 1.00     Types: Cigarettes

## 2016-12-23 NOTE — ED Provider Notes
?   Smokeless tobacco: Never Used   ? Alcohol use Yes      Comment: rarely   ? Drug use: No   ? Sexual activity: Not Asked     Other Topics Concern   ? None     Social History Narrative   ? None       Review of Systems   Constitutional: Negative for fever and chills.   HENT: Negative for nasal congestion and sore throat.    Respiratory: Negative.  Negative for cough and shortness of breath.    Cardiovascular: Negative for chest pain.   Gastrointestinal: Negative for nausea, vomiting and abdominal pain.   Genitourinary: Negative for dysuria and frequency.   Musculoskeletal: Negative for arthralgias.   Skin: Positive for color change and wound (abscess to R index finger).   Neurological: Negative for weakness and numbness.   All other systems reviewed and are negative.      Physical Exam     ED Triage Vitals [12/05/16 1315]   BP 123/70   Pulse 85   Resp 16   Temp 36.6 ?C (97.9 ?F)   Temp src Oral   Height 1.778 m   Weight 101.6 kg   SpO2 97 %   BMI (Calculated) 32.21             Physical Exam   Nursing note and vitals reviewed.      Differential DDx: abscess, cellulitis, tenosynovitis, other    Is this an Emergent Medical Condition? Yes - Severe Pain/Acute Onset of Symptons  409.901 FS  641.19 FS  627.732 (16) FS    ED Workup   Procedures    Labs:  - - No data to display      Imaging (Read by ED Provider):  not applicable      EKG (Read by ED Provider):  not applicable        ED Course & Re-Evaluation     ED Course      Seen in ED last night and given Rx for Keflex and clindamycin from ED last night. States unable to afford ABx. Appears to be small fluid collection on bedside US. Will attempt to drain, administer dose of ABx here.  Schmidt, Andrew, DO 1:38 PM 12/05/2016        ***      MDM   Decide to obtain history from someone other than the patient: No    Decide to obtain previous medical records: Yes - The review of the records selected below is documented in the ED Note : ( Prior ED visit )

## 2016-12-23 NOTE — ED Provider Notes
Nursing note and vitals reviewed.      Differential DDx: abscess, cellulitis, tenosynovitis, other    Is this an Emergent Medical Condition? Yes - Severe Pain/Acute Onset of Symptons  409.901 FS  641.19 FS  627.732 (16) FS    ED Workup   Incision/Drainage  Date/Time: 12/05/2016 3:17 PM  Performed by: Renella CunasSCHMIDT, ANDREW  Authorized by: Renella CunasSCHMIDT, ANDREW   Consent: Verbal consent obtained.  Patient identity confirmed: verbally with patient  Type: abscess  Body area: upper extremity  Location details: right hand  Anesthesia: nerve block    Anesthesia:  Local Anesthetic: lidocaine 1% with epinephrine  Anesthetic total: 2 mL  Scalpel size: 11  Needle gauge: 20  Incision type: single straight  Drainage amount: scant  Wound treatment: wound left open    ED Ultrasound Performed  Date/Time: 12/05/2016 7:41 AM  Performed by: Renella CunasSCHMIDT, ANDREW  Authorized by: Renella CunasSCHMIDT, ANDREW   The ultrasound procedure performed was soft tissue ultrasound.   Reasons for the procedure performed included edema.   Cellulitis present? yes.  Abscess present? no.  Foreign body present? no.  Patient tolerance: Patient tolerated the procedure well with no immediate complications    Patient tolerance: Patient tolerated the procedure well with no immediate complications.    US performed for educational purposes only        Labs:  -   CRP HIGH SENSITIVE - Abnormal        Result Value Ref Range    CRP, High Sensitivity 31.50 (*) 0.00 - 5.00 mg/L    Comment:   Patients receiving carboxypenicillin treatment may have significantly decreased CRP levels.   CBC AUTODIFF - Abnormal     WBC 11.93 (*) 4.5 - 11 x10E3/uL    RBC 4.45 (*) 4.50 - 6.30 x10E6/uL    Hemoglobin 13.8 (*) 14.0 - 18.0 g/dL    Hematocrit 16.138.3 (*) 40.0 - 54.0 %    MCV 86.1  82.0 - 101.0 fl    MCH 31.0  27.0 - 34.0 pg    MCHC 36.0  31.0 - 36.0 g/dL    RDW 09.612.8  04.512.0 - 40.916.1 %    Platelet Count 264  140 - 440 thou/cu mm    MPV 10.6  9.5 - 11.5 fl    nRBC % 0.0  0.0 - 1.0 %    Absolute NRBC Count 0.00

## 2016-12-23 NOTE — ED Provider Notes
Physician Attestation: ***

## 2016-12-23 NOTE — ED Provider Notes
selected below is documented in the ED Note : ( Prior ED visit )    Clinical Lab Test(s): N/A    Diagnostic Tests (Radiology, EKG): N/A    Independent Visualization (ED US, Wet Prep, Other): No    Discussed patient with NON-ED Provider: [facility]ri [facility]      ED Disposition   ED Disposition: No ED Disposition Set      ED Clinical Impression   ED Clinical Impression:   No Clinical Impression Set      ED Patient Status   Patient Status:   {SH ED JX PATIENT STATUS:[id number]}        ED Medical Evaluation Initiated   Medical Evaluation Initiated:  Yes, filed at 12/05/16 1302  by Schmidt, Andrew, DO            Schmidt, Andrew, DO  12/05/16 1306    Scribe Attestation: I, Mikel Cress, have acted as a scribe for Schmidt, Andrew, DO 1:33 PM 12/05/2016     Physician Attestation: ***

## 2016-12-23 NOTE — ED Provider Notes
BASIC METABOLIC PANEL    Sodium 956  135 - 145 mmol/L    Potassium 4.0  3.3 - 4.6 mmol/L    Chloride 101  101 - 110 mmol/L    CO2 25  21 - 29 mmol/L    Urea Nitrogen 11  6 - 22 mg/dL    Creatinine 0.85  0.67 - 1.17 mg/dL    BUN/Creatinine Ratio 12.9  6.0 - 22.0 (calc)    Glucose 83  71 - 99 mg/dL    Calcium 9.1  8.6 - 10.0 mg/dL    Osmolality Calc 276.1      Anion Gap 13  4 - 16 mmol/L    EGFR >59  mL/min/1.73M2    Comment:   Reference range: =>90 ml/min/1.73M2  eGFR estimates are unable to accurately differentiate levels of GFR above 60 ml/min/1.73M2.   CBC AND DIFFERENTIAL         Imaging (Read by ED Provider):  not applicable      EKG (Read by ED Provider):  not applicable        ED Course & Re-Evaluation     ED Course      Seen in ED last night and given Rx for Keflex and clindamycin from ED last night. States unable to afford ABx. Appears to be small fluid collection on bedside US. Will attempt to drain.  Toniann Fail, DO 1:38 PM 12/05/2016        Very small amount of fluid expressed, on [place] prmiarily diffuse cellulitis with no large pocket. Pt has tenderness in all digits and entire hand extending to wrist. Would benefit from IV antibiotics and obs. Given lack of ED beds here and need for Ortho hand eval will send downtown. CDU made aware, also spoke with Ortho junior. Will start IV clindamycin. Will send patient to CDU downtown for IV ABx. Sherri PA made aware of patient.   Toniann Fail, DO 3:01 PM 12/05/2016        MDM   Decide to obtain history from someone other than the patient: No    Decide to obtain previous medical records: Yes - The review of the records selected below is documented in the ED Note : ( Prior ED visit )    Clinical Lab Test(s): N/A    Diagnostic Tests (Radiology, EKG): N/A    Independent Visualization (ED [place], Wet Prep, Other): No    Discussed patient with NON-ED Provider: Sherri PA @ [facility]      ED Disposition   ED Disposition: ED CDU      ED Clinical Impression

## 2016-12-23 NOTE — ED Provider Notes
Nursing note and vitals reviewed.      Differential DDx: abscess, cellulitis, tenosynovitis, other    Is this an Emergent Medical Condition? Yes - Severe Pain/Acute Onset of Symptons  409.901 FS  641.19 FS  627.732 (16) FS    ED Workup   Incision/Drainage  Date/Time: 12/05/2016 3:17 PM  Performed by: SCHMIDT, ANDREW  Authorized by: SCHMIDT, ANDREW   Consent: Verbal consent obtained.  Patient identity confirmed: verbally with patient  Type: abscess  Body area: upper extremity  Location details: right hand  Anesthesia: nerve block    Anesthesia:  Local Anesthetic: lidocaine 1% with epinephrine  Anesthetic total: 2 mL  Scalpel size: 11  Needle gauge: 20  Incision type: single straight  Drainage amount: scant  Wound treatment: wound left open    ED Ultrasound Performed  Date/Time: 12/05/2016 7:41 AM  Performed by: SCHMIDT, ANDREW  Authorized by: SCHMIDT, ANDREW   The ultrasound procedure performed was soft tissue ultrasound.   Reasons for the procedure performed included edema.   Cellulitis present? yes.  Abscess present? no.  Foreign body present? no.  Patient tolerance: Patient tolerated the procedure well with no immediate complications    Patient tolerance: Patient tolerated the procedure well with no immediate complications.    US performed for educational purposes only        Labs:  -   CRP HIGH SENSITIVE - Abnormal        Result Value Ref Range    CRP, High Sensitivity 31.50 (*) 0.00 - 5.00 mg/L    Comment:   Patients receiving carboxypenicillin treatment may have significantly decreased CRP levels.   CBC AUTODIFF - Abnormal     WBC 11.93 (*) 4.5 - 11 x10E3/uL    RBC 4.45 (*) 4.50 - 6.30 x10E6/uL    Hemoglobin 13.8 (*) 14.0 - 18.0 g/dL    Hematocrit 38.3 (*) 40.0 - 54.0 %    MCV 86.1  82.0 - 101.0 fl    MCH 31.0  27.0 - 34.0 pg    MCHC 36.0  31.0 - 36.0 g/dL    RDW 12.8  12.0 - 16.1 %    Platelet Count 264  140 - 440 thou/cu mm    MPV 10.6  9.5 - 11.5 fl    nRBC % 0.0  0.0 - 1.0 %    Absolute NRBC Count 0.00

## 2016-12-23 NOTE — ED Provider Notes
Nursing note and vitals reviewed.      Differential DDx: abscess, cellulitis, tenosynovitis, other    Is this an Emergent Medical Condition? Yes - Severe Pain/Acute Onset of Symptons  409.901 FS  641.19 FS  627.732 (16) FS    ED Workup   Incision/Drainage  Date/Time: 12/05/2016 3:17 PM  Performed by: Renella CunasSCHMIDT, ANDREW  Authorized by: Renella CunasSCHMIDT, ANDREW   Consent: Verbal consent obtained.  Patient identity confirmed: verbally with patient  Type: abscess  Body area: upper extremity  Location details: right hand  Anesthesia: nerve block    Anesthesia:  Local Anesthetic: lidocaine 1% with epinephrine  Anesthetic total: 2 mL  Scalpel size: 11  Needle gauge: 20  Incision type: single straight  Drainage amount: scant  Wound treatment: wound left open          Labs:  -   CRP HIGH SENSITIVE - Abnormal        Result Value Ref Range    CRP, High Sensitivity 31.50 (*) 0.00 - 5.00 mg/L    Comment:   Patients receiving carboxypenicillin treatment may have significantly decreased CRP levels.   CBC AUTODIFF - Abnormal     WBC 11.93 (*) 4.5 - 11 x10E3/uL    RBC 4.45 (*) 4.50 - 6.30 x10E6/uL    Hemoglobin 13.8 (*) 14.0 - 18.0 g/dL    Hematocrit 16.138.3 (*) 40.0 - 54.0 %    MCV 86.1  82.0 - 101.0 fl    MCH 31.0  27.0 - 34.0 pg    MCHC 36.0  31.0 - 36.0 g/dL    RDW 09.612.8  04.512.0 - 40.916.1 %    Platelet Count 264  140 - 440 thou/cu mm    MPV 10.6  9.5 - 11.5 fl    nRBC % 0.0  0.0 - 1.0 %    Absolute NRBC Count 0.00      Neutrophils % 66.8  34.0 - 73.0 %    Lymphocytes % 21.0 (*) 25.0 - 45.0 %    Monocytes % 8.5 (*) 2.0 - 6.0 %    Eosinophils % 3.2  1.0 - 4.0 %    Immature Granulocytes % 0.3  0.0 - 2.0 %    Neutrophils Absolute 7.98  1.80 - 8.70 x10E3/uL    Lymphocytes Absolute 2.51  x10E3/uL    Monocytes Absolute 1.01  x10E3/uL    Eosinophils Absolute 0.38  x10E3/uL    Basophil Absolute 0.02  x10E3/uL    Absolute Immature Granulocytes 0.03 (*) 0 - 0 x10E3/uL    Basophils % 0.2  0 - 1 %   SEDIMENTATION RATE - Normal    Sed Rate 13  0 - 15 mm/hr

## 2016-12-23 NOTE — ED Provider Notes
Independent Visualization (ED US, Wet Prep, Other): No    Discussed patient with NON-ED Provider: [facility]      ED Disposition   ED Disposition: ED CDU      ED Clinical Impression   ED Clinical Impression:   Cellulitis of finger of right hand  Right hand pain  Right wrist pain      ED Patient Status   Patient Status:   Fair        ED Medical Evaluation Initiated   Medical Evaluation Initiated:  Yes, filed at 12/05/16 1302  by Renella CunasSchmidt, Andrew, DO            Renella CunasSchmidt, Andrew, DO  12/05/16 1306    Scribe Attestation: I, Jeffie PollockMikel Cress, have acted as a Neurosurgeonscribe for Renella CunasSchmidt, Andrew, DO 1:33 PM 12/05/2016     Physician Attestation: Wende CreaseI,Andrew Christopher Schmidt, DO, have reviewed and confirmed the information stated by the scribe and made corrections and edits as appropriate.  I have personally provided the services documented by the scribe. 3:20 PM 12/05/2016            Renella CunasSchmidt, Andrew, DO  12/05/16 1520

## 2016-12-23 NOTE — ED Provider Notes
History     Chief Complaint   Patient presents with   ? Cellulitis       26 y.o. male presents with c/o abscess to R index finger x 2 days after suspected spider bite. Seen in the ED yesterday and given Rx for Keflex and clindamycin. Patient notes increased swelling, redness, and pain to finger. Denies any drainage. No fever or N/V. States unable to afford ABx.      The history is provided by the patient.   Abscess   Location:  Finger  Finger abscess location:  R index finger  Abscess quality: painful and redness    Duration:  2 days  Progression:  Worsening  Pain details:     Quality:  Throbbing and tightness    Severity:  Moderate    Duration:  2 days    Timing:  Constant    Progression:  Worsening  Chronicity:  New  Relieved by:  Nothing  Worsened by:  Nothing  Ineffective treatments:  None tried      No Known Allergies    Patient's Medications   New Prescriptions    No medications on file   Previous Medications    No medications on file   Modified Medications    No medications on file   Discontinued Medications    CEPHALEXIN (KEFLEX) 500 MG PO CAPSULE    Take 1 capsule by mouth 4 times daily for 5 days.    CLINDAMYCIN (CLEOCIN) 150 MG PO CAPSULE    Take 2 capsules by mouth 3 times daily for 5 days.    NAPROXEN (NAPROSYN) 500 MG PO TABLET    Take 1 tablet by mouth 2 times daily as needed for other (pain).    TRAMADOL (ULTRAM) 50 MG PO TABLET    Take 1 tablet by mouth every 6 hours as needed for pain.       Past Medical History:   Diagnosis Date   ? High cholesterol        History reviewed. No pertinent surgical history.    History reviewed. No pertinent family history.    Social History     Social History   ? Marital status: Married     Spouse name: N/A   ? Number of children: N/A   ? Years of education: N/A     Social History Main Topics   ? Smoking status: Current Every Day Smoker     Packs/day: 1.00     Types: Cigarettes   ? Smokeless tobacco: Never Used   ? Alcohol use Yes      Comment: rarely

## 2016-12-23 NOTE — ED Provider Notes
Nursing note and vitals reviewed.      Differential DDx: abscess, cellulitis, tenosynovitis, other    Is this an Emergent Medical Condition? Yes - Severe Pain/Acute Onset of Symptons  409.901 FS  641.19 FS  627.732 (16) FS    ED Workup   Incision/Drainage  Date/Time: 12/05/2016 3:17 PM  Performed by: Renella CunasSCHMIDT, ANDREW  Authorized by: Renella CunasSCHMIDT, ANDREW   Consent: Verbal consent obtained.  Patient identity confirmed: verbally with patient  Type: abscess  Body area: upper extremity  Location details: right hand  Anesthesia: nerve block    Anesthesia:  Local Anesthetic: lidocaine 1% with epinephrine  Anesthetic total: 2 mL  Scalpel size: 11  Needle gauge: 20  Incision type: single straight  Drainage amount: scant  Wound treatment: wound left open          Labs:  - - No data to display      Imaging (Read by ED Provider):  not applicable      EKG (Read by ED Provider):  not applicable        ED Course & Re-Evaluation     ED Course      Seen in ED last night and given Rx for Keflex and clindamycin from ED last night. States unable to afford ABx. Appears to be small fluid collection on bedside US. Will attempt to drain.  Renella CunasSchmidt, Andrew, DO 1:38 PM 12/05/2016        Very small amount of fluid expressed, on US prmiarily diffuse cellulitis with no large pocket. Pt has tenderness in all digits and entire hand extending to wrist. Would benefit from IV antibiotics and obs. Given lack of ED beds here and need for Ortho hand eval will send downtown. CDU made aware, also spoke with Ortho junior. Will start IV clindamycin. Will send patient to CDU downtown for IV ABx. Sherri PA made aware of patient.   Renella CunasSchmidt, Andrew, DO 3:01 PM 12/05/2016        MDM   Decide to obtain history from someone other than the patient: No    Decide to obtain previous medical records: Yes - The review of the records selected below is documented in the ED Note : ( Prior ED visit )    Clinical Lab Test(s): N/A    Diagnostic Tests (Radiology, EKG): N/A

## 2016-12-23 NOTE — ED Provider Notes
History   No chief complaint on file.      HPI    No Known Allergies    Patient's Medications   New Prescriptions    No medications on file   Previous Medications    CEPHALEXIN (KEFLEX) 500 MG PO CAPSULE    Take 1 capsule by mouth 4 times daily for 5 days.    CLINDAMYCIN (CLEOCIN) 150 MG PO CAPSULE    Take 2 capsules by mouth 3 times daily for 5 days.    NAPROXEN (NAPROSYN) 500 MG PO TABLET    Take 1 tablet by mouth 2 times daily as needed for other (pain).    TRAMADOL (ULTRAM) 50 MG PO TABLET    Take 1 tablet by mouth every 6 hours as needed for pain.   Modified Medications    No medications on file   Discontinued Medications    No medications on file       Past Medical History:   Diagnosis Date   ? High cholesterol        No past surgical history on file.    No family history on file.    Social History     Social History   ? Marital status: Married     Spouse name: N/A   ? Number of children: N/A   ? Years of education: N/A     Social History Main Topics   ? Smoking status: Current Every Day Smoker     Packs/day: 1.00     Types: Cigarettes   ? Smokeless tobacco: Never Used   ? Alcohol use Yes      Comment: rarely   ? Drug use: No   ? Sexual activity: Not on file     Other Topics Concern   ? Not on file     Social History Narrative   ? No narrative on file       Review of Systems    Physical Exam     ED Triage Vitals   BP    Pulse    Resp    Temp    Temp src    Height    Weight    SpO2    BMI (Calculated)              Physical Exam    Differential DDx: ***    Is this an Emergent Medical Condition? {SH ED EMERGENT MEDICAL CONDITION:1602057001}  409.901 FS  641.19 FS  627.732 (16) FS    ED Workup   Procedures    Labs:  - - No data to display      Imaging (Read by ED Provider):  {Imaging findings:1603150103}      EKG (Read by ED Provider):  {EKG findings:1603150102}        ED Course & Re-Evaluation     ED Course          MDM   Decide to obtain history from someone other than the patient: {SH ED JX

## 2016-12-23 NOTE — ED Provider Notes
History     Chief Complaint   Patient presents with   ? Cellulitis       26 y.o. male presents with c/o abscess to R index finger x 2 days after suspected spider bite. Seen in the ED yesterday and given Rx for Keflex and clindamycin. Patient notes increased swelling, redness, and pain to finger. Denies any drainage. No fever or N/V. States unable to afford ABx.      The history is provided by the patient.   Abscess   Location:  Finger  Finger abscess location:  R index finger  Abscess quality: painful and redness    Duration:  2 days  Progression:  Worsening  Pain details:     Quality:  Throbbing and tightness    Severity:  Moderate    Duration:  2 days    Timing:  Constant    Progression:  Worsening  Chronicity:  New  Relieved by:  Nothing  Worsened by:  Nothing  Ineffective treatments:  None tried  Associated symptoms: no fever, no nausea and no vomiting        No Known Allergies    Patient's Medications   New Prescriptions    No medications on file   Previous Medications    No medications on file   Modified Medications    No medications on file   Discontinued Medications    CEPHALEXIN (KEFLEX) 500 MG PO CAPSULE    Take 1 capsule by mouth 4 times daily for 5 days.    CLINDAMYCIN (CLEOCIN) 150 MG PO CAPSULE    Take 2 capsules by mouth 3 times daily for 5 days.    NAPROXEN (NAPROSYN) 500 MG PO TABLET    Take 1 tablet by mouth 2 times daily as needed for other (pain).    TRAMADOL (ULTRAM) 50 MG PO TABLET    Take 1 tablet by mouth every 6 hours as needed for pain.       Past Medical History:   Diagnosis Date   ? High cholesterol        History reviewed. No pertinent surgical history.    History reviewed. No pertinent family history.    Social History     Social History   ? Marital status: Married     Spouse name: N/A   ? Number of children: N/A   ? Years of education: N/A     Social History Main Topics   ? Smoking status: Current Every Day Smoker     Packs/day: 1.00     Types: Cigarettes

## 2016-12-23 NOTE — ED Provider Notes
MDM - OBTAIN ZOXWRUE:[id number]}HISTORY:28378}    Decide to obtain previous medical records: [facility]{SH ED Lamonte SakaiJX MDM - PREVIOUS MED REC - NO WJX:91478}YES:28380}    Clinical Lab Test(s): {SH ED Lamonte SakaiJX MDM ORDERED AND REVIEWED:28124}    Diagnostic Tests (Radiology, EKG): {SH ED Lamonte SakaiJX MDM ORDERED AND REVIEWED:28124}    Independent Visualization (ED US, Wet Prep, Other): {SH ED Lamonte SakaiJX MDM NO YES GNFAOZHY:86578}WILDCARD:26444}    Discussed patient with NON-ED Provider: {SH ED Lamonte SakaiJX MDM - ANOTHER PROVIDER:28381}      ED Disposition   ED Disposition: No ED Disposition Set      ED Clinical Impression   ED Clinical Impression:   No Clinical Impression Set      ED Patient Status   Patient Status:   [facility][phone][facility]        ED Medical Evaluation Initiated   Medical Evaluation Initiated:  Yes, filed at 12/05/16 1302  by Renella CunasSchmidt, Andrew, DO            Renella CunasSchmidt, Andrew, DO  12/05/16 1306

## 2016-12-23 NOTE — ED Provider Notes
Nursing note and vitals reviewed.      Differential DDx: abscess, cellulitis, tenosynovitis, other    Is this an Emergent Medical Condition? Yes - Severe Pain/Acute Onset of Symptons  409.901 FS  641.19 FS  627.732 (16) FS    ED Workup   Incision/Drainage  Date/Time: 12/05/2016 3:17 PM  Performed by: SCHMIDT, ANDREW  Authorized by: SCHMIDT, ANDREW   Consent: Verbal consent obtained.  Patient identity confirmed: verbally with patient  Type: abscess  Body area: upper extremity  Location details: right hand  Anesthesia: nerve block    Anesthesia:  Local Anesthetic: lidocaine 1% with epinephrine  Anesthetic total: 2 mL  Scalpel size: 11  Needle gauge: 20  Incision type: single straight  Drainage amount: scant  Wound treatment: wound left open    ED Ultrasound Performed  Date/Time: 12/16/2016 7:41 AM  Performed by: SCHMIDT, ANDREW  Authorized by: SCHMIDT, ANDREW   The ultrasound procedure performed was soft tissue ultrasound.   Reasons for the procedure performed included edema.   Cellulitis present? yes.  Abscess present? no.  Foreign body present? no.  Patient tolerance: Patient tolerated the procedure well with no immediate complications    Patient tolerance: Patient tolerated the procedure well with no immediate complications.    US performed for educational purposes only        Labs:  -   CRP HIGH SENSITIVE - Abnormal        Result Value Ref Range    CRP, High Sensitivity 31.50 (*) 0.00 - 5.00 mg/L    Comment:   Patients receiving carboxypenicillin treatment may have significantly decreased CRP levels.   CBC AUTODIFF - Abnormal     WBC 11.93 (*) 4.5 - 11 x10E3/uL    RBC 4.45 (*) 4.50 - 6.30 x10E6/uL    Hemoglobin 13.8 (*) 14.0 - 18.0 g/dL    Hematocrit 38.3 (*) 40.0 - 54.0 %    MCV 86.1  82.0 - 101.0 fl    MCH 31.0  27.0 - 34.0 pg    MCHC 36.0  31.0 - 36.0 g/dL    RDW 12.8  12.0 - 16.1 %    Platelet Count 264  140 - 440 thou/cu mm    MPV 10.6  9.5 - 11.5 fl    nRBC % 0.0  0.0 - 1.0 %    Absolute NRBC Count 0.00

## 2016-12-23 NOTE — ED Provider Notes
Neutrophils % 66.8  34.0 - 73.0 %    Lymphocytes % 21.0 (*) 25.0 - 45.0 %    Monocytes % 8.5 (*) 2.0 - 6.0 %    Eosinophils % 3.2  1.0 - 4.0 %    Immature Granulocytes % 0.3  0.0 - 2.0 %    Neutrophils Absolute 7.98  1.80 - 8.70 x10E3/uL    Lymphocytes Absolute 2.51  x10E3/uL    Monocytes Absolute 1.01  x10E3/uL    Eosinophils Absolute 0.38  x10E3/uL    Basophil Absolute 0.02  x10E3/uL    Absolute Immature Granulocytes 0.03 (*) 0 - 0 x10E3/uL    Basophils % 0.2  0 - 1 %   SEDIMENTATION RATE - Normal    Sed Rate 13  0 - 15 mm/hr   BASIC METABOLIC PANEL    Sodium 284  135 - 145 mmol/L    Potassium 4.0  3.3 - 4.6 mmol/L    Chloride 101  101 - 110 mmol/L    CO2 25  21 - 29 mmol/L    Urea Nitrogen 11  6 - 22 mg/dL    Creatinine 0.85  0.67 - 1.17 mg/dL    BUN/Creatinine Ratio 12.9  6.0 - 22.0 (calc)    Glucose 83  71 - 99 mg/dL    Calcium 9.1  8.6 - 10.0 mg/dL    Osmolality Calc 276.1      Anion Gap 13  4 - 16 mmol/L    EGFR >59  mL/min/1.73M2    Comment:   Reference range: =>90 ml/min/1.73M2  eGFR estimates are unable to accurately differentiate levels of GFR above 60 ml/min/1.73M2.   CBC AND DIFFERENTIAL         Imaging (Read by ED Provider):  not applicable      EKG (Read by ED Provider):  not applicable        ED Course & Re-Evaluation     ED Course      Seen in ED last night and given Rx for Keflex and clindamycin from ED last night. States unable to afford ABx. Appears to be small fluid collection on bedside US. Will attempt to drain.  Toniann Fail, DO 1:38 PM 12/05/2016        Very small amount of fluid expressed, on [place] prmiarily diffuse cellulitis with no large pocket. Pt has tenderness in all digits and entire hand extending to wrist. Would benefit from IV antibiotics and obs. Given lack of ED beds here and need for Ortho hand eval will send downtown. CDU made aware, also spoke with Ortho junior. Will start IV clindamycin. Will send patient to CDU downtown for IV ABx. Sherri PA made aware of patient.

## 2016-12-23 NOTE — ED Provider Notes
Independent Visualization (ED US, Wet Prep, Other): No    Discussed patient with NON-ED Provider: Sherri PA @ [facility]      ED Disposition   ED Disposition: ED CDU      ED Clinical Impression   ED Clinical Impression:   Cellulitis of finger of right hand  Right hand pain  Right wrist pain      ED Patient Status   Patient Status:   Fair        ED Medical Evaluation Initiated   Medical Evaluation Initiated:  Yes, filed at 12/05/16 1302  by Schmidt, Andrew, DO            Schmidt, Andrew, DO  12/05/16 1306    Scribe Attestation: I, Mikel Cress, have acted as a scribe for Schmidt, Andrew, DO 1:33 PM 12/05/2016     Physician Attestation: I,Andrew Christopher Schmidt, DO, have reviewed and confirmed the information stated by the scribe and made corrections and edits as appropriate.  I have personally provided the services documented by the scribe. 3:20 PM 12/05/2016            Schmidt, Andrew, DO  12/05/16 1520

## 2016-12-23 NOTE — ED Provider Notes
?   Drug use: No   ? Sexual activity: Not Asked     Other Topics Concern   ? None     Social History Narrative   ? None       Review of Systems    Physical Exam     ED Triage Vitals [12/05/16 1315]   BP 123/70   Pulse 85   Resp 16   Temp 36.6 ?C (97.9 ?F)   Temp src Oral   Height 1.778 m   Weight 101.6 kg   SpO2 97 %   BMI (Calculated) 32.21             Physical Exam    Differential DDx: abscess, cellulitis, tenosynovitis, other    Is this an Emergent Medical Condition? Yes - Severe Pain/Acute Onset of Symptons  409.901 FS  641.19 FS  627.732 (16) FS    ED Workup   Procedures    Labs:  - - No data to display      Imaging (Read by ED Provider):  not applicable      EKG (Read by ED Provider):  not applicable        ED Course & Re-Evaluation     ED Course      Seen in ED last night and given Rx for Keflex and clindamycin from ED last night. States unable to afford ABx. Appears to be small fluid collection on bedside US. Will attempt to drain, administer dose of ABx here.  Renella CunasSchmidt, Andrew, DO 1:38 PM 12/05/2016        ***      MDM   Decide to obtain history from someone other than the patient: No    Decide to obtain previous medical records: Yes - The review of the records selected below is documented in the ED Note : ( Prior ED visit )    Clinical Lab Test(s): N/A    Diagnostic Tests (Radiology, EKG): N/A    Independent Visualization (ED US, Wet Prep, Other): No    Discussed patient with NON-ED Provider: None      ED Disposition   ED Disposition: No ED Disposition Set      ED Clinical Impression   ED Clinical Impression:   No Clinical Impression Set      ED Patient Status   Patient Status:   [facility][phone]}        ED Medical Evaluation Initiated   Medical Evaluation Initiated:  Yes, filed at 12/05/16 1302  by Renella CunasSchmidt, Andrew, DO            Renella CunasSchmidt, Andrew, DO  12/05/16 1306    Scribe Attestation: I, Jeffie PollockMikel Cress, have acted as a Neurosurgeonscribe for Anheuser-BuschSchmidt, Andrew, DO 1:33 PM 12/05/2016

## 2016-12-23 NOTE — ED Provider Notes
selected below is documented in the ED Note : ( Prior ED visit )    Clinical Lab Test(s): N/A    Diagnostic Tests (Radiology, EKG): N/A    Independent Visualization (ED US, Wet Prep, Other): No    Discussed patient with NON-ED Provider: Sherri PA @ [facility]      ED Disposition   ED Disposition: No ED Disposition Set      ED Clinical Impression   ED Clinical Impression:   No Clinical Impression Set      ED Patient Status   Patient Status:   [phone]}        ED Medical Evaluation Initiated   Medical Evaluation Initiated:  Yes, filed at 12/05/16 1302  by Renella CunasSchmidt, Andrew, DO            Renella CunasSchmidt, Andrew, DO  12/05/16 1306    Scribe Attestation: I, Jeffie PollockMikel Cress, have acted as a Neurosurgeonscribe for Anheuser-BuschSchmidt, Andrew, DO 1:33 PM 12/05/2016     Physician Attestation: Marland Kitchen***

## 2016-12-23 NOTE — ED Provider Notes
Clinical Lab Test(s): N/A    Diagnostic Tests (Radiology, EKG): N/A    Independent Visualization (ED US, Wet Prep, Other): No    Discussed patient with NON-ED Provider: None      ED Disposition   ED Disposition: No ED Disposition Set      ED Clinical Impression   ED Clinical Impression:   No Clinical Impression Set      ED Patient Status   Patient Status:   {SH ED JX PATIENT STATUS:[id number]}        ED Medical Evaluation Initiated   Medical Evaluation Initiated:  Yes, filed at 12/05/16 1302  by Schmidt, Andrew, DO            Schmidt, Andrew, DO  12/05/16 1306    Scribe Attestation: I, Mikel Cress, have acted as a scribe for Schmidt, Andrew, DO 1:33 PM 12/05/2016     Physician Attestation: ***

## 2016-12-23 NOTE — ED Provider Notes
Clinical Lab Test(s): N/A    Diagnostic Tests (Radiology, EKG): N/A    Independent Visualization (ED US, Wet Prep, Other): No    Discussed patient with NON-ED Provider: None      ED Disposition   ED Disposition: No ED Disposition Set      ED Clinical Impression   ED Clinical Impression:   No Clinical Impression Set      ED Patient Status   Patient Status:   {SH ED JX PATIENT STATUS:[id number]}        ED Medical Evaluation Initiated   Medical Evaluation Initiated:  Yes, filed at 12/05/16 1302  by Renella CunasSchmidt, Andrew, DO            Renella CunasSchmidt, Andrew, DO  12/05/16 1306    Scribe Attestation: I, Jeffie PollockMikel Cress, have acted as a Neurosurgeonscribe for Anheuser-BuschSchmidt, Andrew, DO 1:33 PM 12/05/2016     Physician Attestation: Marland Kitchen***

## 2016-12-23 NOTE — ED Provider Notes
?   Drug use: No   ? Sexual activity: Not Asked     Other Topics Concern   ? None     Social History Narrative   ? None       Review of Systems    Physical Exam     ED Triage Vitals [12/05/16 1315]   BP 123/70   Pulse 85   Resp 16   Temp 36.6 ?C (97.9 ?F)   Temp src Oral   Height 1.778 m   Weight 101.6 kg   SpO2 97 %   BMI (Calculated) 32.21             Physical Exam    Differential DDx: abscess, cellulitis, tenosynovitis, other    Is this an Emergent Medical Condition? Yes - Severe Pain/Acute Onset of Symptons  409.901 FS  641.19 FS  627.732 (16) FS    ED Workup   Procedures    Labs:  - - No data to display      Imaging (Read by ED Provider):  not applicable      EKG (Read by ED Provider):  not applicable        ED Course & Re-Evaluation     ED Course      Seen in ED last night and given Rx for Keflex and clindamycin from ED last night. States unable to afford ABx. Appears to be small fluid collection on bedside US. Will attempt to drain, administer dose of ABx here.  Schmidt, Andrew, DO 1:38 PM 12/05/2016        ***      MDM   Decide to obtain history from someone other than the patient: No    Decide to obtain previous medical records: Yes - The review of the records selected below is documented in the ED Note : ( Prior ED visit )    Clinical Lab Test(s): N/A    Diagnostic Tests (Radiology, EKG): N/A    Independent Visualization (ED US, Wet Prep, Other): No    Discussed patient with NON-ED Provider: None      ED Disposition   ED Disposition: No ED Disposition Set      ED Clinical Impression   ED Clinical Impression:   No Clinical Impression Set      ED Patient Status   Patient Status:   {SH ED JX PATIENT STATUS:[id number]}        ED Medical Evaluation Initiated   Medical Evaluation Initiated:  Yes, filed at 12/05/16 1302  by Schmidt, Andrew, DO            Schmidt, Andrew, DO  12/05/16 1306    Scribe Attestation: I, Mikel Cress, have acted as a scribe for Schmidt, Andrew, DO 1:33 PM 12/05/2016

## 2016-12-23 NOTE — ED Provider Notes
Nursing note and vitals reviewed.      Differential DDx: abscess, cellulitis, tenosynovitis, other    Is this an Emergent Medical Condition? Yes - Severe Pain/Acute Onset of Symptons  409.901 FS  641.19 FS  627.732 (16) FS    ED Workup   Incision/Drainage  Date/Time: 12/05/2016 3:17 PM  Performed by: SCHMIDT, ANDREW  Authorized by: SCHMIDT, ANDREW   Consent: Verbal consent obtained.  Patient identity confirmed: verbally with patient  Type: abscess  Body area: upper extremity  Location details: right hand  Anesthesia: nerve block    Anesthesia:  Local Anesthetic: lidocaine 1% with epinephrine  Anesthetic total: 2 mL  Scalpel size: 11  Needle gauge: 20  Incision type: single straight  Drainage amount: scant  Wound treatment: wound left open          Labs:  -   CRP HIGH SENSITIVE - Abnormal        Result Value Ref Range    CRP, High Sensitivity 31.50 (*) 0.00 - 5.00 mg/L    Comment:   Patients receiving carboxypenicillin treatment may have significantly decreased CRP levels.   CBC AUTODIFF - Abnormal     WBC 11.93 (*) 4.5 - 11 x10E3/uL    RBC 4.45 (*) 4.50 - 6.30 x10E6/uL    Hemoglobin 13.8 (*) 14.0 - 18.0 g/dL    Hematocrit 38.3 (*) 40.0 - 54.0 %    MCV 86.1  82.0 - 101.0 fl    MCH 31.0  27.0 - 34.0 pg    MCHC 36.0  31.0 - 36.0 g/dL    RDW 12.8  12.0 - 16.1 %    Platelet Count 264  140 - 440 thou/cu mm    MPV 10.6  9.5 - 11.5 fl    nRBC % 0.0  0.0 - 1.0 %    Absolute NRBC Count 0.00      Neutrophils % 66.8  34.0 - 73.0 %    Lymphocytes % 21.0 (*) 25.0 - 45.0 %    Monocytes % 8.5 (*) 2.0 - 6.0 %    Eosinophils % 3.2  1.0 - 4.0 %    Immature Granulocytes % 0.3  0.0 - 2.0 %    Neutrophils Absolute 7.98  1.80 - 8.70 x10E3/uL    Lymphocytes Absolute 2.51  x10E3/uL    Monocytes Absolute 1.01  x10E3/uL    Eosinophils Absolute 0.38  x10E3/uL    Basophil Absolute 0.02  x10E3/uL    Absolute Immature Granulocytes 0.03 (*) 0 - 0 x10E3/uL    Basophils % 0.2  0 - 1 %   SEDIMENTATION RATE - Normal    Sed Rate 13  0 - 15 mm/hr

## 2016-12-23 NOTE — ED Provider Notes
ED Clinical Impression:   Cellulitis of finger of right hand  Right hand pain  Right wrist pain      ED Patient Status   Patient Status:   Fair        ED Medical Evaluation Initiated   Medical Evaluation Initiated:  Yes, filed at 12/05/16 1302  by Schmidt, Andrew, DO            Schmidt, Andrew, DO  12/05/16 1306    Scribe Attestation: I, Mikel Cress, have acted as a scribe for Schmidt, Andrew, DO 1:33 PM 12/05/2016     Physician Attestation: I,Andrew Christopher Schmidt, DO, have reviewed and confirmed the information stated by the scribe and made corrections and edits as appropriate.  I have personally provided the services documented by the scribe. 3:20 PM 12/05/2016            Schmidt, Andrew, DO  12/05/16 1520       Schmidt, Andrew, DO  12/05/16 1604

## 2016-12-23 NOTE — ED Provider Notes
History   No chief complaint on file.      HPI    No Known Allergies    Patient's Medications   New Prescriptions    No medications on file   Previous Medications    CEPHALEXIN (KEFLEX) 500 MG PO CAPSULE    Take 1 capsule by mouth 4 times daily for 5 days.    CLINDAMYCIN (CLEOCIN) 150 MG PO CAPSULE    Take 2 capsules by mouth 3 times daily for 5 days.    NAPROXEN (NAPROSYN) 500 MG PO TABLET    Take 1 tablet by mouth 2 times daily as needed for other (pain).    TRAMADOL (ULTRAM) 50 MG PO TABLET    Take 1 tablet by mouth every 6 hours as needed for pain.   Modified Medications    No medications on file   Discontinued Medications    No medications on file       Past Medical History:   Diagnosis Date   ? High cholesterol        No past surgical history on file.    No family history on file.    Social History     Social History   ? Marital status: Married     Spouse name: N/A   ? Number of children: N/A   ? Years of education: N/A     Social History Main Topics   ? Smoking status: Current Every Day Smoker     Packs/day: 1.00     Types: Cigarettes   ? Smokeless tobacco: Never Used   ? Alcohol use Yes      Comment: rarely   ? Drug use: No   ? Sexual activity: Not on file     Other Topics Concern   ? Not on file     Social History Narrative   ? No narrative on file       Review of Systems    Physical Exam     ED Triage Vitals   BP    Pulse    Resp    Temp    Temp src    Height    Weight    SpO2    BMI (Calculated)              Physical Exam    Differential DDx: ***    Is this an Emergent Medical Condition? {SH ED EMERGENT MEDICAL CONDITION:[phone]}  409.901 FS  641.19 FS  627.732 (16) FS    ED Workup   Procedures    Labs:  - - No data to display      Imaging (Read by ED Provider):  {Imaging findings:[phone]}      EKG (Read by ED Provider):  {EKG findings:[id number]}        ED Course & Re-Evaluation     ED Course          MDM   Decide to obtain history from someone other than the patient: [facility]

## 2016-12-23 NOTE — ED Provider Notes
?   Smokeless tobacco: Never Used   ? Alcohol use Yes      Comment: rarely   ? Drug use: No   ? Sexual activity: Not Asked     Other Topics Concern   ? None     Social History Narrative   ? None       Review of Systems   Constitutional: Negative for fever and chills.   HENT: Negative for nasal congestion and sore throat.    Respiratory: Negative.  Negative for cough and shortness of breath.    Cardiovascular: Negative for chest pain.   Gastrointestinal: Negative for nausea, vomiting and abdominal pain.   Genitourinary: Negative for dysuria and frequency.   Musculoskeletal: Negative for arthralgias.   Skin: Positive for color change and wound (abscess to R index finger).   Neurological: Negative for weakness and numbness.   All other systems reviewed and are negative.      Physical Exam     ED Triage Vitals [12/05/16 1315]   BP 123/70   Pulse 85   Resp 16   Temp 36.6 ?C (97.9 ?F)   Temp src Oral   Height 1.778 m   Weight 101.6 kg   SpO2 97 %   BMI (Calculated) 32.21             Physical Exam   Nursing note and vitals reviewed.      Differential DDx: abscess, cellulitis, tenosynovitis, other    Is this an Emergent Medical Condition? Yes - Severe Pain/Acute Onset of Symptons  409.901 FS  641.19 FS  627.732 (16) FS    ED Workup   Procedures    Labs:  - - No data to display      Imaging (Read by ED Provider):  not applicable      EKG (Read by ED Provider):  not applicable        ED Course & Re-Evaluation     ED Course      Seen in ED last night and given Rx for Keflex and clindamycin from ED last night. States unable to afford ABx. Appears to be small fluid collection on bedside US. Will attempt to drain.  Renella CunasSchmidt, Andrew, DO 1:38 PM 12/05/2016        Will send patient to CDU downtown for IV ABx. Sherri PA made aware of patient.   Renella CunasSchmidt, Andrew, DO 3:01 PM 12/05/2016        MDM   Decide to obtain history from someone other than the patient: No    Decide to obtain previous medical records: Yes - The review of the records

## 2016-12-23 NOTE — ED Provider Notes
?   Smokeless tobacco: Never Used   ? Alcohol use Yes      Comment: rarely   ? Drug use: No   ? Sexual activity: Not Asked     Other Topics Concern   ? None     Social History Narrative   ? None       Review of Systems   Constitutional: Negative for fever and chills.   HENT: Negative for nasal congestion and sore throat.    Respiratory: Negative.  Negative for cough and shortness of breath.    Cardiovascular: Negative for chest pain.   Gastrointestinal: Negative for nausea, vomiting and abdominal pain.   Genitourinary: Negative for dysuria and frequency.   Musculoskeletal: Negative for arthralgias.   Skin: Positive for color change and wound (abscess to R index finger).   Neurological: Negative for weakness and numbness.   All other systems reviewed and are negative.      Physical Exam     ED Triage Vitals [12/05/16 1315]   BP 123/70   Pulse 85   Resp 16   Temp 36.6 ?C (97.9 ?F)   Temp src Oral   Height 1.778 m   Weight 101.6 kg   SpO2 97 %   BMI (Calculated) 32.21             Physical Exam   Nursing note and vitals reviewed.      Differential DDx: abscess, cellulitis, tenosynovitis, other    Is this an Emergent Medical Condition? Yes - Severe Pain/Acute Onset of Symptons  409.901 FS  641.19 FS  627.732 (16) FS    ED Workup   Procedures    Labs:  - - No data to display      Imaging (Read by ED Provider):  not applicable      EKG (Read by ED Provider):  not applicable        ED Course & Re-Evaluation     ED Course      Seen in ED last night and given Rx for Keflex and clindamycin from ED last night. States unable to afford ABx. Appears to be small fluid collection on bedside US. Will attempt to drain, administer dose of ABx here.  Renella CunasSchmidt, Andrew, DO 1:38 PM 12/05/2016        ***      MDM   Decide to obtain history from someone other than the patient: No    Decide to obtain previous medical records: Yes - The review of the records selected below is documented in the ED Note : ( Prior ED visit )

## 2016-12-23 NOTE — ED Provider Notes
Renella CunasSchmidt, Andrew, DO 3:01 PM 12/05/2016        MDM   Decide to obtain history from someone other than the patient: No    Decide to obtain previous medical records: Yes - The review of the records selected below is documented in the ED Note : ( Prior ED visit )    Clinical Lab Test(s): N/A    Diagnostic Tests (Radiology, EKG): N/A    Independent Visualization (ED US, Wet Prep, Other): No    Discussed patient with NON-ED Provider: Sherri PA @ [place] [facility]      ED Disposition   ED Disposition: ED CDU      ED Clinical Impression   ED Clinical Impression:   Cellulitis of finger of right hand  Right hand pain  Right wrist pain      ED Patient Status   Patient Status:   Fair        ED Medical Evaluation Initiated   Medical Evaluation Initiated:  Yes, filed at 12/05/16 1302  by Renella CunasSchmidt, Andrew, DO            Renella CunasSchmidt, Andrew, DO  12/05/16 1306    Scribe Attestation: I, Jeffie PollockMikel Cress, have acted as a Neurosurgeonscribe for Renella CunasSchmidt, Andrew, DO 1:33 PM 12/05/2016     Physician Attestation: Wende CreaseI,Andrew Christopher Schmidt, DO, have reviewed and confirmed the information stated by the scribe and made corrections and edits as appropriate.  I have personally provided the services documented by the scribe. 3:20 PM 12/05/2016            Renella CunasSchmidt, Andrew, DO  12/05/16 1520       Renella CunasSchmidt, Andrew, DO  12/05/16 1604       Renella CunasSchmidt, Andrew, DO  12/16/16 [id number]       Renella CunasSchmidt, Andrew, DO  12/23/16 [id number]

## 2016-12-23 NOTE — ED Provider Notes
ED Clinical Impression:   Cellulitis of finger of right hand  Right hand pain  Right wrist pain      ED Patient Status   Patient Status:   Fair        ED Medical Evaluation Initiated   Medical Evaluation Initiated:  Yes, filed at 12/05/16 1302  by Renella CunasSchmidt, Andrew, DO            Renella CunasSchmidt, Andrew, DO  12/05/16 1306    Scribe Attestation: I, Jeffie PollockMikel Cress, have acted as a Neurosurgeonscribe for Renella CunasSchmidt, Andrew, DO 1:33 PM 12/05/2016     Physician Attestation: Wende CreaseI,Andrew Christopher Schmidt, DO, have reviewed and confirmed the information stated by the scribe and made corrections and edits as appropriate.  I have personally provided the services documented by the scribe. 3:20 PM 12/05/2016            Renella CunasSchmidt, Andrew, DO  12/05/16 1520       Renella CunasSchmidt, Andrew, DO  12/05/16 [phone]

## 2016-12-23 NOTE — ED Provider Notes
Schmidt, Andrew, DO 3:01 PM 12/05/2016        MDM   Decide to obtain history from someone other than the patient: No    Decide to obtain previous medical records: Yes - The review of the records selected below is documented in the ED Note : ( Prior ED visit )    Clinical Lab Test(s): N/A    Diagnostic Tests (Radiology, EKG): N/A    Independent Visualization (ED US, Wet Prep, Other): No    Discussed patient with NON-ED Provider: Sherri PA @ [place] [facility]      ED Disposition   ED Disposition: ED CDU      ED Clinical Impression   ED Clinical Impression:   Cellulitis of finger of right hand  Right hand pain  Right wrist pain      ED Patient Status   Patient Status:   Fair        ED Medical Evaluation Initiated   Medical Evaluation Initiated:  Yes, filed at 12/05/16 1302  by Schmidt, Andrew, DO            Schmidt, Andrew, DO  12/05/16 1306    Scribe Attestation: I, Mikel Cress, have acted as a scribe for Schmidt, Andrew, DO 1:33 PM 12/05/2016     Physician Attestation: I,Andrew Christopher Schmidt, DO, have reviewed and confirmed the information stated by the scribe and made corrections and edits as appropriate.  I have personally provided the services documented by the scribe. 3:20 PM 12/05/2016            Schmidt, Andrew, DO  12/05/16 1520       Schmidt, Andrew, DO  12/05/16 1604       Schmidt, Andrew, DO  12/16/16 0742       Schmidt, Andrew, DO  12/23/16 0402

## 2016-12-23 NOTE — ED Provider Notes
?   Smokeless tobacco: Never Used   ? Alcohol use Yes      Comment: rarely   ? Drug use: No   ? Sexual activity: Not Asked     Other Topics Concern   ? None     Social History Narrative   ? None       Review of Systems   Constitutional: Negative for fever and chills.   HENT: Negative for nasal congestion and sore throat.    Respiratory: Negative.  Negative for cough and shortness of breath.    Cardiovascular: Negative for chest pain.   Gastrointestinal: Negative for nausea, vomiting and abdominal pain.   Genitourinary: Negative for dysuria and frequency.   Musculoskeletal: Negative for arthralgias.   Skin: Positive for color change and wound (abscess to R index finger).   Neurological: Negative for weakness and numbness.   All other systems reviewed and are negative.      Physical Exam     ED Triage Vitals [12/05/16 1315]   BP 123/70   Pulse 85   Resp 16   Temp 36.6 ?C (97.9 ?F)   Temp src Oral   Height 1.778 m   Weight 101.6 kg   SpO2 97 %   BMI (Calculated) 32.21             Physical Exam   Nursing note and vitals reviewed.      Differential DDx: abscess, cellulitis, tenosynovitis, other    Is this an Emergent Medical Condition? Yes - Severe Pain/Acute Onset of Symptons  409.901 FS  641.19 FS  627.732 (16) FS    ED Workup   Procedures    Labs:  - - No data to display      Imaging (Read by ED Provider):  not applicable      EKG (Read by ED Provider):  not applicable        ED Course & Re-Evaluation     ED Course      Seen in ED last night and given Rx for Keflex and clindamycin from ED last night. States unable to afford ABx. Appears to be small fluid collection on bedside US. Will attempt to drain.  Schmidt, Andrew, DO 1:38 PM 12/05/2016        Will send patient to CDU downtown for IV ABx. Sherri PA made aware of patient.   Schmidt, Andrew, DO 3:01 PM 12/05/2016        MDM   Decide to obtain history from someone other than the patient: No    Decide to obtain previous medical records: Yes - The review of the records

## 2016-12-23 NOTE — ED Provider Notes
Nursing note and vitals reviewed.      Differential DDx: abscess, cellulitis, tenosynovitis, other    Is this an Emergent Medical Condition? Yes - Severe Pain/Acute Onset of Symptons  409.901 FS  641.19 FS  627.732 (16) FS    ED Workup   Incision/Drainage  Date/Time: 12/05/2016 3:17 PM  Performed by: Renella CunasSCHMIDT, ANDREW  Authorized by: Renella CunasSCHMIDT, ANDREW   Consent: Verbal consent obtained.  Patient identity confirmed: verbally with patient  Type: abscess  Body area: upper extremity  Location details: right hand  Anesthesia: nerve block    Anesthesia:  Local Anesthetic: lidocaine 1% with epinephrine  Anesthetic total: 2 mL  Scalpel size: 11  Needle gauge: 20  Incision type: single straight  Drainage amount: scant  Wound treatment: wound left open    ED Ultrasound Performed  Date/Time: 12/16/2016 7:41 AM  Performed by: Renella CunasSCHMIDT, ANDREW  Authorized by: Renella CunasSCHMIDT, ANDREW   The ultrasound procedure performed was soft tissue ultrasound.   Reasons for the procedure performed included edema.   Cellulitis present? yes.  Abscess present? no.  Foreign body present? no.  Patient tolerance: Patient tolerated the procedure well with no immediate complications    Patient tolerance: Patient tolerated the procedure well with no immediate complications.    US performed for educational purposes only        Labs:  -   CRP HIGH SENSITIVE - Abnormal        Result Value Ref Range    CRP, High Sensitivity 31.50 (*) 0.00 - 5.00 mg/L    Comment:   Patients receiving carboxypenicillin treatment may have significantly decreased CRP levels.   CBC AUTODIFF - Abnormal     WBC 11.93 (*) 4.5 - 11 x10E3/uL    RBC 4.45 (*) 4.50 - 6.30 x10E6/uL    Hemoglobin 13.8 (*) 14.0 - 18.0 g/dL    Hematocrit 84.138.3 (*) 40.0 - 54.0 %    MCV 86.1  82.0 - 101.0 fl    MCH 31.0  27.0 - 34.0 pg    MCHC 36.0  31.0 - 36.0 g/dL    RDW 32.412.8  40.112.0 - 02.716.1 %    Platelet Count 264  140 - 440 thou/cu mm    MPV 10.6  9.5 - 11.5 fl    nRBC % 0.0  0.0 - 1.0 %    Absolute NRBC Count 0.00

## 2016-12-23 NOTE — ED Provider Notes
Nursing note and vitals reviewed.      Differential DDx: abscess, cellulitis, tenosynovitis, other    Is this an Emergent Medical Condition? Yes - Severe Pain/Acute Onset of Symptons  409.901 FS  641.19 FS  627.732 (16) FS    ED Workup   Incision/Drainage  Date/Time: 12/05/2016 3:17 PM  Performed by: SCHMIDT, ANDREW  Authorized by: SCHMIDT, ANDREW   Consent: Verbal consent obtained.  Patient identity confirmed: verbally with patient  Type: abscess  Body area: upper extremity  Location details: right hand  Anesthesia: nerve block    Anesthesia:  Local Anesthetic: lidocaine 1% with epinephrine  Anesthetic total: 2 mL  Scalpel size: 11  Needle gauge: 20  Incision type: single straight  Drainage amount: scant  Wound treatment: wound left open          Labs:  - - No data to display      Imaging (Read by ED Provider):  not applicable      EKG (Read by ED Provider):  not applicable        ED Course & Re-Evaluation     ED Course      Seen in ED last night and given Rx for Keflex and clindamycin from ED last night. States unable to afford ABx. Appears to be small fluid collection on bedside US. Will attempt to drain.  Schmidt, Andrew, DO 1:38 PM 12/05/2016        Very small amount of fluid expressed, on US prmiarily diffuse cellulitis with no large pocket. Pt has tenderness in all digits and entire hand extending to wrist. Would benefit from IV antibiotics and obs. Given lack of ED beds here and need for Ortho hand eval will send downtown. CDU made aware, also spoke with Ortho junior. Will start IV clindamycin. Will send patient to CDU downtown for IV ABx. Sherri PA made aware of patient.   Schmidt, Andrew, DO 3:01 PM 12/05/2016        MDM   Decide to obtain history from someone other than the patient: No    Decide to obtain previous medical records: Yes - The review of the records selected below is documented in the ED Note : ( Prior ED visit )    Clinical Lab Test(s): N/A    Diagnostic Tests (Radiology, EKG): N/A

## 2017-02-28 ENCOUNTER — Emergency Department (HOSPITAL_COMMUNITY): Payer: Self-pay

## 2017-02-28 ENCOUNTER — Emergency Department (HOSPITAL_COMMUNITY)
Admission: EM | Admit: 2017-02-28 | Discharge: 2017-02-28 | Disposition: A | Discharge status: Home / Self Care | Payer: Self-pay | Attending: Emergency Medicine | Admitting: Emergency Medicine

## 2017-02-28 ENCOUNTER — Encounter (HOSPITAL_COMMUNITY): Payer: Self-pay | Admitting: Emergency Medicine

## 2017-02-28 DIAGNOSIS — Y33XXXA Other specified events, undetermined intent, initial encounter: Secondary | ICD-10-CM | POA: Insufficient documentation

## 2017-02-28 DIAGNOSIS — F17210 Nicotine dependence, cigarettes, uncomplicated: Secondary | ICD-10-CM | POA: Insufficient documentation

## 2017-02-28 DIAGNOSIS — Y93H3 Activity, building and construction: Secondary | ICD-10-CM | POA: Insufficient documentation

## 2017-02-28 DIAGNOSIS — Y9269 Other specified industrial and construction area as the place of occurrence of the external cause: Secondary | ICD-10-CM | POA: Insufficient documentation

## 2017-02-28 DIAGNOSIS — S61011A Laceration without foreign body of right thumb without damage to nail, initial encounter: Secondary | ICD-10-CM | POA: Insufficient documentation

## 2017-02-28 DIAGNOSIS — Y999 Unspecified external cause status: Secondary | ICD-10-CM | POA: Insufficient documentation

## 2017-02-28 MED ORDER — ACETAMINOPHEN 500 MG PO TABS: 1000.0000 mg | ORAL_TABLET | Freq: Once | ORAL | Status: DC

## 2017-02-28 MED ORDER — LIDOCAINE HCL (PF) 1 % IJ SOLN
10.0000 mL | Freq: Once | INTRAMUSCULAR | Status: AC
  Administered 2017-02-28: 10 mL via INTRADERMAL
  Filled 2017-02-28: qty 10

## 2017-02-28 MED ORDER — HYDROCODONE-ACETAMINOPHEN 5-325 MG PO TABS
2.0000 | ORAL_TABLET | Freq: Once | ORAL | Status: AC
  Administered 2017-02-28: 2 via ORAL
  Filled 2017-02-28: qty 2

## 2017-02-28 MED ORDER — HYDROCODONE-ACETAMINOPHEN 5-325 MG PO TABS: 1.0000 | ORAL_TABLET | Freq: Four times a day (QID) | ORAL | 0 refills | Status: DC | PRN

## 2017-02-28 MED ORDER — BACITRACIN ZINC 500 UNIT/GM EX OINT
TOPICAL_OINTMENT | Freq: Once | CUTANEOUS | Status: AC
  Administered 2017-02-28: 1 via TOPICAL
  Filled 2017-02-28: qty 0.9

## 2017-02-28 MED ORDER — HYDROCODONE-ACETAMINOPHEN 5-325 MG PO TABS: 1.0000 | ORAL_TABLET | ORAL | 0 refills | Status: AC | PRN

## 2017-02-28 MED ORDER — CEPHALEXIN 500 MG PO CAPS: 500.0000 mg | ORAL_CAPSULE | Freq: Four times a day (QID) | ORAL | 0 refills | Status: AC

## 2017-02-28 MED ORDER — CEPHALEXIN 500 MG PO CAPS: 500.0000 mg | ORAL_CAPSULE | Freq: Four times a day (QID) | ORAL | 0 refills | Status: DC

## 2017-02-28 NOTE — ED Notes (Signed)
See EDP secondary assessment.  

## 2017-02-28 NOTE — ED Notes (Signed)
Patient's right hand placed in basin with Betadine and saline. No distress noted.

## 2017-02-28 NOTE — ED Notes (Signed)
Patient in x ray, will be transported to [facility] upon completion

## 2017-02-28 NOTE — ED Provider Notes (Signed)
[facility]-EMERGENCY DEPT Provider Note   CSN: [id number] Arrival date & time: 02/28/17  1125     History   Chief Complaint Chief Complaint  Patient presents with  . Hand Injury    HPI Hector Adams is a 26 y.o. male who presents with right thumb laceration that occurred this morning. Patient is unsure how the laceration occurred. He states that he works at a [employer] site and looked down and noticed that there is bleeding from the thumb. On ED arrival, bleeding is controlled.  Patient was not wearing gloves at the time. Patient reports some numbness to the right thumb but does note that this is not new. He states that he has had persistent numbness in the tips of his fingers from a previous broken hand injury. He has not taken any medications for the pain. Patient states that his last tetanus was one year ago.  The history is provided by the patient.    History reviewed. No pertinent past medical history.  There are no active problems to display for this patient.   History reviewed. No pertinent surgical history.     Home Medications    Prior to Admission medications   Medication Sig Start Date End Date Taking? Authorizing Provider  cephALEXin (KEFLEX) 500 MG capsule Take 1 capsule (500 mg total) by mouth 4 (four) times daily. 02/28/17   Maxwell Caul, PA-C  HYDROcodone-acetaminophen (NORCO/VICODIN) 5-325 MG tablet Take 1-2 tablets by mouth every 6 (six) hours as needed. 02/28/17   Maxwell Caul, PA-C    Family History History reviewed. No pertinent family history.  Social History Social History  Substance Use Topics  . Smoking status: Current Every Day Smoker    Packs/day: 2.00    Types: Cigarettes  . Smokeless tobacco: Never Used  . Alcohol use No     Allergies   Patient has no allergy information on record.   Review of Systems Review of Systems  Skin: Positive for wound.  Neurological: Positive for numbness. Negative for weakness.     Physical  Exam Updated Vital Signs BP 138/90 (BP Location: Left Arm)   Pulse 99   Temp 98.7 F (37.1 C) (Oral)   Resp 19   Ht 5\' 10"  (1.778 m)   Wt 104.3 kg (230 lb)   SpO2 97%   BMI 33.00 kg/m   Physical Exam  Constitutional: He appears well-developed and well-nourished.  HENT:  Head: Normocephalic and atraumatic.  Eyes: Conjunctivae and EOM are normal. Right eye exhibits no discharge. Left eye exhibits no discharge. No scleral icterus.  Cardiovascular:  Pulses:      Radial pulses are 2+ on the right side, and 2+ on the left side.  Pulmonary/Chest: Effort normal.  Musculoskeletal:  Flexion/extension of right thumb intact. Opposition of right thumb intact. DIP joint is able to flex and extend against resistance when held in isolation. Patient is able to make a fist without any difficulty.  Neurological: He is alert.  Diminished sensation to the distal aspect of the right thumb. Patient states that this is chronic, due to a previous hand injury from where he lost sensation. He reports that this is not new. He is able to feel sharp objects when pressed firmly against the distal aspect of the hand. Otherwise sensation intact at all major nerve distributions. Bilateral grip strength intact.  Skin: Skin is warm and dry. Capillary refill takes less than 2 seconds.  2 cm, V-shaped laceration to the dorsal aspect of the right thumb  at the base of the MCP.  Psychiatric: He has a normal mood and affect. His speech is normal and behavior is normal.  Nursing note and vitals reviewed.    ED Treatments / Results  Labs (all labs ordered are listed, but only abnormal results are displayed) Labs Reviewed - No data to display  EKG  EKG Interpretation None       Radiology Dg Finger Thumb Right  Result Date: 02/28/2017 CLINICAL DATA:  Right thumb laceration. EXAM: RIGHT THUMB 2+V COMPARISON:  No recent prior. FINDINGS: Soft tissue structures laceration. Slight increased density noted over the  interspace between the right first and second digits. This is not present on one view. This may represent overlying soft tissues/skin flap. No definite foreign bodies identified. No acute bony abnormality identified . IMPRESSION: Soft tissue laceration and soft tissue swelling. No acute bony abnormality identified. Electronically Signed   By: Maisie Fus  Register   On: 02/28/2017 12:18    Procedures .Marland KitchenLaceration Repair Date/Time: 02/28/2017 2:05 PM Performed by: Graciella Freer A Authorized by: Margarita Grizzle   Consent:    Consent obtained:  Verbal   Consent given by:  Patient   Risks discussed:  Retained foreign body and infection Anesthesia (see MAR for exact dosages):    Anesthesia method:  Local infiltration   Local anesthetic:  Lidocaine 1% w/o epi Laceration details:    Location:  Finger   Finger location:  R thumb   Length (cm):  2 Repair type:    Repair type:  Simple Pre-procedure details:    Preparation:  Patient was prepped and draped in usual sterile fashion Exploration:    Hemostasis achieved with:  Direct pressure   Wound exploration: wound explored through full range of motion     Wound extent: foreign bodies/material   Treatment:    Area cleansed with:  Betadine and saline   Amount of cleaning:  Extensive   Irrigation solution:  Sterile saline   Irrigation method:  Syringe and pressure wash   Visualized foreign bodies/material removed: yes   Skin repair:    Repair method:  Sutures   Suture size:  5-0   Suture material:  Nylon   Number of sutures:  7 Approximation:    Approximation:  Close   Vermilion border: well-aligned   Post-procedure details:    Dressing:  Antibiotic ointment, sterile dressing and splint for protection   Patient tolerance of procedure:  Tolerated well, no immediate complications Comments:     The wound was soaked in a solution of Betadine and saline. After the wound was anesthetized, the entire hand was scrubbed with a Betadine surgical  scrub.The wound was thoroughly and extensively irrigated with sterile saline. During this process, small particles of what appeared to be clean dirt were removed. Approximately 1 L of saline was used to flush out the hand until there is no more visualized foreign bodies. The wound was thoroughly explored through full range of motion. No evidence of tendon injury or foreign body. Sutures were placed. Patient was placed in a sterile dressing and a splint for protection and stabilization. Patient tolerated the procedure well.   (including critical care time)  Medications Ordered in ED Medications  acetaminophen (TYLENOL) tablet 1,000 mg (1,000 mg Oral Refused 02/28/17 1239)  bacitracin ointment (not administered)  HYDROcodone-acetaminophen (NORCO/VICODIN) 5-325 MG per tablet 2 tablet (not administered)  lidocaine (PF) (XYLOCAINE) 1 % injection 10 mL (10 mLs Intradermal Given by Other 02/28/17 1241)     Initial Impression /  Assessment and Plan / ED Course  I have reviewed the triage vital signs and the nursing notes.  Pertinent labs & imaging results that were available during my care of the patient were reviewed by me and considered in my medical decision making (see chart for details).     26 year old male who presents with right thumb laceration that occurred this morning. Tetanus is up-to-date. Patient is afebrile, non-toxic appearing, sitting comfortably on examination table. Vital signs reviewed and stable. Patient reports some numbness to the distal aspect of the right thumb but does state that this is chronic in nature. Otherwise neurovascularly intact. X-rays ordered at triage for further evaluation.  X-rays reviewed. Negative for any acute fracture dislocation. Will plan to provide wound care and department and thoroughly cleaned the wound. Plan to repair in the department.  Laceration repaired as documented above. Patient tolerated the procedure well. Placed in a support splint for  stabilization. Patient instructed to follow-up with the hand surgeon for further evaluation. Started patient on antibiotic therapy given the extent of dirt and concern for risk of infection. Conservative therapies discussed at home. Strict return precautions discussed. Patient expresses understanding and agreement to plan.   Final Clinical Impressions(s) / ED Diagnoses   Final diagnoses:  Thumb laceration, right, initial encounter    New Prescriptions New Prescriptions   CEPHALEXIN (KEFLEX) 500 MG CAPSULE    Take 1 capsule (500 mg total) by mouth 4 (four) times daily.   HYDROCODONE-ACETAMINOPHEN (NORCO/VICODIN) 5-325 MG TABLET    Take 1-2 tablets by mouth every 6 (six) hours as needed.     Maxwell Caul, PA-C 02/28/17 1410    Margarita Grizzle, MD 03/03/17 [phone]

## 2017-02-28 NOTE — ED Triage Notes (Signed)
Pt to ER for evaluation of right thumb laceration unknown what caused the injury, patient works in [employer]. Bleeding controlled. States entire thumb is numb, ROM intact however. States last tetanus was last year.

## 2017-02-28 NOTE — Discharge Instructions (Addendum)
Keep the wound clean and dry for the first 24 hours. After that you may gently clean the wound with soap and water. Make sure to pat dry the wound before covering it with any dressing. You can use topical antibiotic ointment and bandage. Ice and elevate for pain relief.   You can take Tylenol or Ibuprofen as directed for pain. Take pain medication for severe break through pain.  Take antibiotics as directed. Please take all of your antibiotics until finished.  Follow-up with the hand surgeon in 2-4 days.   Return to the Emergency Department, your primary care doctor, or the [facility] in 7 days for suture removal.   Monitor closely for any signs of infection. Return to the Emergency Department for any worsening redness/swelling of the area that begins to spread, drainage from the site, worsening pain, fever or any other worsening or concerning symptoms.

## 2018-02-23 IMAGING — DX DG FINGER THUMB 2+V*R*
3 series · 3 of 3 positions shown · non-contrast
Comparison: No recent prior.

CLINICAL DATA: Right thumb laceration.

EXAM:
RIGHT THUMB 2+V

[finger ap]
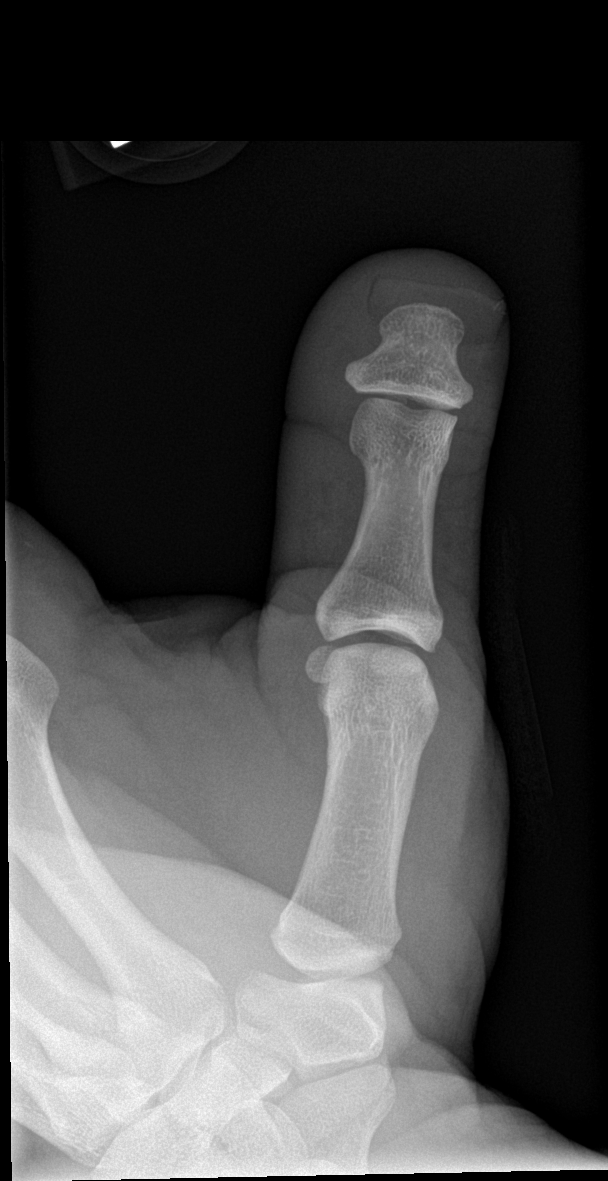

[finger obl]
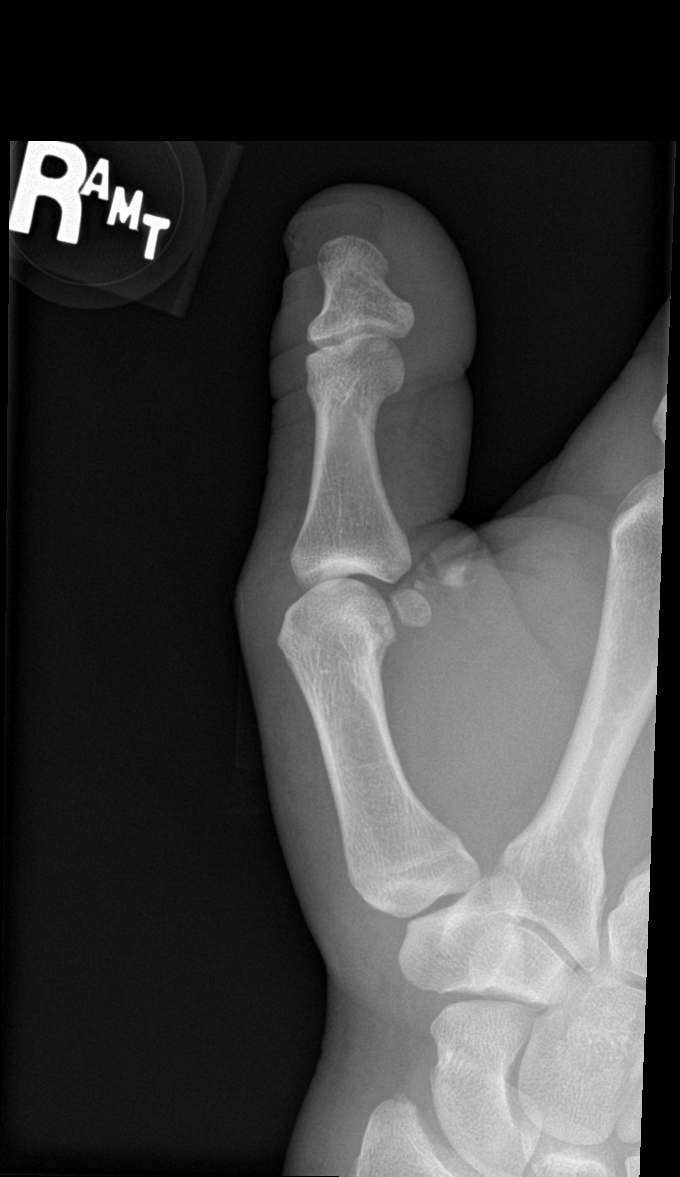

[finger lat]
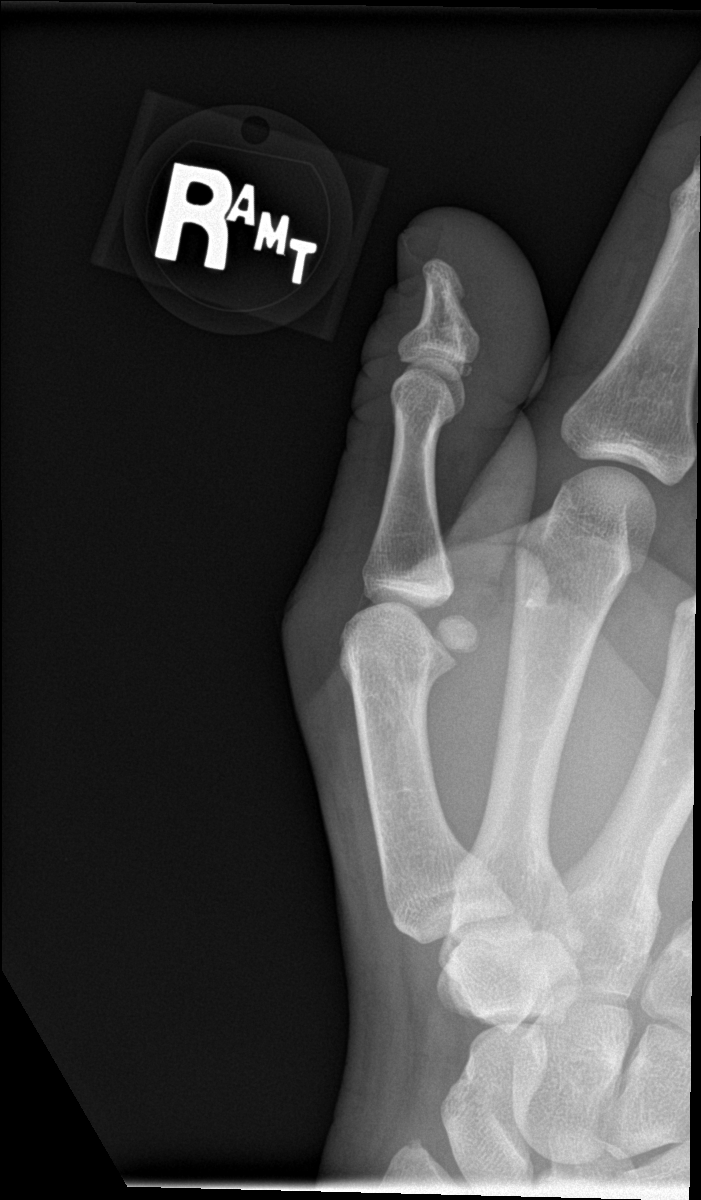

[3 of 3 positions shown; findings below may reference images not displayed]

FINDINGS: Soft tissue structures laceration. Slight increased density noted
over the interspace between the right first and second digits. This
is not present on one view. This may represent overlying soft
tissues/skin flap. No definite foreign bodies identified. No acute
bony abnormality identified .
IMPRESSION: Soft tissue laceration and soft tissue swelling. No acute bony
abnormality identified.
